# Patient Record
Sex: Female | Born: 1955 | Race: Black or African American | Hispanic: No | Smoking: Never smoker
Health system: Southern US, Community
[De-identification: ages and names within clinical notes are randomized; demographics above are authoritative.]

## PROBLEM LIST (undated history)

## (undated) DIAGNOSIS — I10 Essential (primary) hypertension: Secondary | ICD-10-CM

## (undated) DIAGNOSIS — J45909 Unspecified asthma, uncomplicated: Secondary | ICD-10-CM

## (undated) DIAGNOSIS — M549 Dorsalgia, unspecified: Secondary | ICD-10-CM

## (undated) DIAGNOSIS — G4730 Sleep apnea, unspecified: Secondary | ICD-10-CM

## (undated) DIAGNOSIS — E559 Vitamin D deficiency, unspecified: Secondary | ICD-10-CM

## (undated) DIAGNOSIS — D649 Anemia, unspecified: Secondary | ICD-10-CM

## (undated) DIAGNOSIS — T7840XA Allergy, unspecified, initial encounter: Secondary | ICD-10-CM

## (undated) HISTORY — DX: Anemia, unspecified: D64.9

## (undated) HISTORY — DX: Vitamin D deficiency, unspecified: E55.9

## (undated) HISTORY — DX: Allergy, unspecified, initial encounter: T78.40XA

## (undated) HISTORY — PX: FOOT SURGERY: SHX648

## (undated) HISTORY — DX: Essential (primary) hypertension: I10

## (undated) HISTORY — PX: WISDOM TOOTH EXTRACTION: SHX21

## (undated) HISTORY — DX: Unspecified asthma, uncomplicated: J45.909

## (undated) HISTORY — DX: Dorsalgia, unspecified: M54.9

## (undated) HISTORY — DX: Sleep apnea, unspecified: G47.30

---

## 1995-12-25 HISTORY — PX: PARTIAL HYSTERECTOMY: SHX80

## 2009-12-24 HISTORY — PX: GASTRIC BYPASS: SHX52

## 2012-08-28 ENCOUNTER — Other Ambulatory Visit: Payer: Self-pay | Admitting: Internal Medicine

## 2012-08-28 DIAGNOSIS — Z1231 Encounter for screening mammogram for malignant neoplasm of breast: Secondary | ICD-10-CM

## 2012-09-10 ENCOUNTER — Ambulatory Visit
Admission: RE | Admit: 2012-09-10 | Discharge: 2012-09-10 | Disposition: A | Discharge status: Home / Self Care | Payer: 59 | Source: Ambulatory Visit | Attending: Internal Medicine | Admitting: Internal Medicine

## 2012-09-10 DIAGNOSIS — Z1231 Encounter for screening mammogram for malignant neoplasm of breast: Secondary | ICD-10-CM

## 2013-10-19 ENCOUNTER — Other Ambulatory Visit: Payer: Self-pay

## 2013-10-19 DIAGNOSIS — Z1231 Encounter for screening mammogram for malignant neoplasm of breast: Secondary | ICD-10-CM

## 2013-11-06 ENCOUNTER — Ambulatory Visit
Admission: RE | Admit: 2013-11-06 | Discharge: 2013-11-06 | Disposition: A | Discharge status: Home / Self Care | Payer: 59 | Source: Ambulatory Visit

## 2013-11-06 DIAGNOSIS — Z1231 Encounter for screening mammogram for malignant neoplasm of breast: Secondary | ICD-10-CM

## 2015-07-26 ENCOUNTER — Other Ambulatory Visit: Payer: Self-pay

## 2015-07-26 DIAGNOSIS — Z1231 Encounter for screening mammogram for malignant neoplasm of breast: Secondary | ICD-10-CM

## 2015-08-02 ENCOUNTER — Ambulatory Visit
Admission: RE | Admit: 2015-08-02 | Discharge: 2015-08-02 | Disposition: A | Discharge status: Home / Self Care | Payer: 59 | Source: Ambulatory Visit

## 2015-08-02 DIAGNOSIS — Z1231 Encounter for screening mammogram for malignant neoplasm of breast: Secondary | ICD-10-CM

## 2016-08-20 ENCOUNTER — Other Ambulatory Visit: Payer: Self-pay | Admitting: Internal Medicine

## 2016-08-20 DIAGNOSIS — Z1231 Encounter for screening mammogram for malignant neoplasm of breast: Secondary | ICD-10-CM

## 2016-09-07 ENCOUNTER — Ambulatory Visit
Admission: RE | Admit: 2016-09-07 | Discharge: 2016-09-07 | Disposition: A | Discharge status: Home / Self Care | Payer: 59 | Source: Ambulatory Visit | Attending: Internal Medicine | Admitting: Internal Medicine

## 2016-09-07 DIAGNOSIS — Z1231 Encounter for screening mammogram for malignant neoplasm of breast: Secondary | ICD-10-CM

## 2017-03-13 ENCOUNTER — Ambulatory Visit (INDEPENDENT_AMBULATORY_CARE_PROVIDER_SITE_OTHER): Payer: BLUE CROSS/BLUE SHIELD | Admitting: Physician Assistant

## 2017-03-13 ENCOUNTER — Ambulatory Visit (INDEPENDENT_AMBULATORY_CARE_PROVIDER_SITE_OTHER): Payer: Self-pay

## 2017-03-13 DIAGNOSIS — G8929 Other chronic pain: Secondary | ICD-10-CM | POA: Diagnosis not present

## 2017-03-13 DIAGNOSIS — M7061 Trochanteric bursitis, right hip: Secondary | ICD-10-CM | POA: Diagnosis not present

## 2017-03-13 DIAGNOSIS — M25562 Pain in left knee: Secondary | ICD-10-CM | POA: Diagnosis not present

## 2017-03-13 DIAGNOSIS — M25561 Pain in right knee: Secondary | ICD-10-CM

## 2017-03-13 DIAGNOSIS — M7062 Trochanteric bursitis, left hip: Secondary | ICD-10-CM | POA: Diagnosis not present

## 2017-03-13 NOTE — Progress Notes (Signed)
Office Visit Note   Patient: Nicole Maynard           Date of Birth: 1956/01/05           MRN: [id number] Visit Date: 03/13/2017              Requested by: Alysia PennaScott Holwerda, MD [address] [place], [place] [id number] PCP: Alysia PennaHOLWERDA, SCOTT, MD   Assessment & Plan: Visit Diagnoses:  1. Chronic pain of both knees   2. Trochanteric bursitis of both hips     Plan:Discussed with her cortisone injections so she defers. Therefore will have her DIP band stretching quad strengthening. She'll continue her natural anti-inflammatories. We will see if she can be approved for Synvisc one injections in both knees and have her return once these are available. Questions encouraged and answered at length by Dr. Magnus IvanBlackman and myself.  Follow-Up Instructions: No Follow-up on file.   Orders:  Orders Placed This Encounter  Procedures  . XR Knee 1-2 Views Left  . XR Knee 1-2 Views Right   No orders of the defined types were placed in this encounter.     Procedures: No procedures performed   Clinical Data: No additional findings.   Subjective: Bilateral knee pain  HPI Mrs. Nicole Maynard is a 61 year old female who was last seen by Dr. Magnus IvanBlackman in 2014. At that time was having knee pain is given a cortisone injection both knees did well until recently. Now over the past few months is having pain with prolonged sitting bilateral knees. Also has difficulty going up and downstairs tickly going down stairs and with the right knee. Mechanical symptoms positive on the right only for giving way. No injury to either knee. She does take Aleve occasionally and that this helps. She is on Tumeric. Does have some pain lateral aspect of both hips with a history of trochanteric bursitis which was treated with injections. She states she does not want injections in her hips or knees today. She really does not want to take any prescription medications. She is asking for exercises that she can do with her  trainer.  Review of Systems Denies fevers chills, calf pain, chest pain or shortness of breath. Otherwise please see history of present illness.  Objective: Vital Signs: There were no vitals taken for this visit.  Physical Exam  Constitutional: She is oriented to person, place, and time. She appears well-developed and well-nourished. No distress.  Pulmonary/Chest: Effort normal.  Neurological: She is alert and oriented to person, place, and time.  Skin: She is not diaphoretic.  Psychiatric: She has a normal mood and affect. Her behavior is normal.    Ortho Exam Good range of motion bilateral knees. No significant crepitus with passive range of motion of either knee. Tenderness along medial lateral joint line of both knees. No instability valgus varus stressing of either knee. Anterior drawer is negative bilaterally. Murray's is negative bilaterally. No effusion abnormal warmth erythema of either knee. Tenderness peripatellar right greater than left. Positive Manfred Shirtssmond Clarke on the left. Good range of motion of bilateral hips without pain. Tenderness over the trochanteric region of both hips Specialty Comments:  No specialty comments available.  Imaging: Xr Knee 1-2 Views Left  Result Date: 03/13/2017 Knee 2 views: No acute fracture knees well located. Moderate medial compartmental arthritic changes. Mild patellofemoral. No bony abnormalities otherwise  Xr Knee 1-2 Views Right  Result Date: 03/13/2017 Right knee AP lateral views: No acute fractures. These well located. 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 Mild  patellofemoral arthritic changes.    PMFS History: There are no active problems to display for this patient.  No past medical history on file.  No family history on file.  No past surgical history on file. Social History   Occupational History  . Not on file.   Social History Main Topics  . Smoking status: Not on file  . Smokeless tobacco: Not on file  .  Alcohol use Not on file  . Drug use: Unknown  . Sexual activity: Not on file

## 2017-04-18 ENCOUNTER — Ambulatory Visit (INDEPENDENT_AMBULATORY_CARE_PROVIDER_SITE_OTHER): Payer: BLUE CROSS/BLUE SHIELD | Admitting: Orthopaedic Surgery

## 2017-08-01 ENCOUNTER — Other Ambulatory Visit: Payer: Self-pay | Admitting: Internal Medicine

## 2017-08-01 DIAGNOSIS — Z1231 Encounter for screening mammogram for malignant neoplasm of breast: Secondary | ICD-10-CM

## 2017-09-09 ENCOUNTER — Ambulatory Visit
Admission: RE | Admit: 2017-09-09 | Discharge: 2017-09-09 | Disposition: A | Discharge status: Home / Self Care | Payer: BLUE CROSS/BLUE SHIELD | Source: Ambulatory Visit | Attending: Internal Medicine | Admitting: Internal Medicine

## 2017-09-09 DIAGNOSIS — Z1231 Encounter for screening mammogram for malignant neoplasm of breast: Secondary | ICD-10-CM

## 2017-11-25 ENCOUNTER — Ambulatory Visit: Payer: PRIVATE HEALTH INSURANCE | Admitting: Psychology

## 2018-08-07 ENCOUNTER — Other Ambulatory Visit: Payer: Self-pay | Admitting: Internal Medicine

## 2018-08-07 DIAGNOSIS — Z1231 Encounter for screening mammogram for malignant neoplasm of breast: Secondary | ICD-10-CM

## 2018-08-28 ENCOUNTER — Other Ambulatory Visit: Payer: Self-pay | Admitting: Internal Medicine

## 2018-08-28 DIAGNOSIS — M5412 Radiculopathy, cervical region: Secondary | ICD-10-CM

## 2018-08-29 ENCOUNTER — Other Ambulatory Visit: Payer: Self-pay | Admitting: Internal Medicine

## 2018-08-29 DIAGNOSIS — M5412 Radiculopathy, cervical region: Secondary | ICD-10-CM

## 2018-09-03 ENCOUNTER — Ambulatory Visit
Admission: RE | Admit: 2018-09-03 | Discharge: 2018-09-03 | Disposition: A | Discharge status: Home / Self Care | Payer: BLUE CROSS/BLUE SHIELD | Source: Ambulatory Visit | Attending: Internal Medicine | Admitting: Internal Medicine

## 2018-09-03 DIAGNOSIS — M5412 Radiculopathy, cervical region: Secondary | ICD-10-CM

## 2018-09-10 ENCOUNTER — Ambulatory Visit: Payer: Self-pay

## 2018-09-11 ENCOUNTER — Ambulatory Visit
Admission: RE | Admit: 2018-09-11 | Discharge: 2018-09-11 | Disposition: A | Discharge status: Home / Self Care | Payer: BLUE CROSS/BLUE SHIELD | Source: Ambulatory Visit | Attending: Internal Medicine | Admitting: Internal Medicine

## 2018-09-11 DIAGNOSIS — Z1231 Encounter for screening mammogram for malignant neoplasm of breast: Secondary | ICD-10-CM

## 2019-08-03 ENCOUNTER — Other Ambulatory Visit: Payer: Self-pay | Admitting: Internal Medicine

## 2019-08-03 DIAGNOSIS — Z1231 Encounter for screening mammogram for malignant neoplasm of breast: Secondary | ICD-10-CM

## 2019-09-15 ENCOUNTER — Other Ambulatory Visit: Payer: Self-pay

## 2019-09-15 ENCOUNTER — Ambulatory Visit
Admission: RE | Admit: 2019-09-15 | Discharge: 2019-09-15 | Disposition: A | Discharge status: Home / Self Care | Payer: BC Managed Care – PPO | Source: Ambulatory Visit | Attending: Internal Medicine | Admitting: Internal Medicine

## 2019-09-15 DIAGNOSIS — Z1231 Encounter for screening mammogram for malignant neoplasm of breast: Secondary | ICD-10-CM

## 2020-10-25 ENCOUNTER — Encounter: Payer: Self-pay | Admitting: Internal Medicine

## 2020-12-05 ENCOUNTER — Other Ambulatory Visit: Payer: Self-pay

## 2020-12-05 ENCOUNTER — Ambulatory Visit (AMBULATORY_SURGERY_CENTER): Payer: Self-pay

## 2020-12-05 VITALS — Ht 66.0 in | Wt 189.0 lb

## 2020-12-05 DIAGNOSIS — Z1211 Encounter for screening for malignant neoplasm of colon: Secondary | ICD-10-CM

## 2020-12-05 NOTE — Progress Notes (Signed)

## 2020-12-08 ENCOUNTER — Encounter: Payer: Self-pay | Admitting: Internal Medicine

## 2020-12-19 ENCOUNTER — Encounter: Payer: BC Managed Care – PPO | Admitting: Internal Medicine

## 2021-01-25 ENCOUNTER — Ambulatory Visit (AMBULATORY_SURGERY_CENTER): Payer: BC Managed Care – PPO | Admitting: Internal Medicine

## 2021-01-25 ENCOUNTER — Encounter: Payer: Self-pay | Admitting: Internal Medicine

## 2021-01-25 ENCOUNTER — Other Ambulatory Visit: Payer: Self-pay

## 2021-01-25 VITALS — BP 116/68 | HR 66 | Temp 97.5°F | Resp 18 | Ht 66.0 in | Wt 189.0 lb

## 2021-01-25 DIAGNOSIS — Z1211 Encounter for screening for malignant neoplasm of colon: Secondary | ICD-10-CM

## 2021-01-25 MED ORDER — SODIUM CHLORIDE 0.9 % IV SOLN
500.0000 mL | INTRAVENOUS | Status: AC
Start: 2021-01-25 — End: ?

## 2021-01-25 NOTE — Progress Notes (Signed)
Report given to PACU, vss 

## 2021-01-25 NOTE — Patient Instructions (Addendum)
The colonoscopy exam was normal today.  I did not see any signs of polyps or cancer.  Next routine colonoscopy or other screening test in 10 years - 2032.  I appreciate the opportunity to care for you. Iva Boop, MD, [facility]   Discharge instructions given. Normal exam. Resume previous medications. YOU HAD AN ENDOSCOPIC PROCEDURE TODAY AT THE [facility]:   Refer to the procedure report that was given to you for any specific questions about what was found during the examination.  If the procedure report does not answer your questions, please call your gastroenterologist to clarify.  If you requested that your care partner not be given the details of your procedure findings, then the procedure report has been included in a sealed envelope for you to review at your convenience later.  YOU SHOULD EXPECT: Some feelings of bloating in the abdomen. Passage of more gas than usual.  Walking can help get rid of the air that was put into your GI tract during the procedure and reduce the bloating. If you had a lower endoscopy (such as a colonoscopy or flexible sigmoidoscopy) you may notice spotting of blood in your stool or on the toilet paper. If you underwent a bowel prep for your procedure, you may not have a normal bowel movement for a few days.  Please Note:  You might notice some irritation and congestion in your nose or some drainage.  This is from the oxygen used during your procedure.  There is no need for concern and it should clear up in a day or so.  SYMPTOMS TO REPORT IMMEDIATELY:   Following lower endoscopy (colonoscopy or flexible sigmoidoscopy):  Excessive amounts of blood in the stool  Significant tenderness or worsening of abdominal pains  Swelling of the abdomen that is new, acute  Fever of 100F or higher   For urgent or emergent issues, a gastroenterologist can be reached at any hour by calling [phone]. Do not use MyChart messaging for urgent concerns.     DIET:  We do recommend a small meal at first, but then you may proceed to your regular diet.  Drink plenty of fluids but you should avoid alcoholic beverages for 24 hours.  ACTIVITY:  You should plan to take it easy for the rest of today and you should NOT DRIVE or use heavy machinery until tomorrow (because of the sedation medicines used during the test).    FOLLOW UP: Our staff will call the number listed on your records 48-72 hours following your procedure to check on you and address any questions or concerns that you may have regarding the information given to you following your procedure. If we do not reach you, we will leave a message.  We will attempt to reach you two times.  During this call, we will ask if you have developed any symptoms of COVID 19. If you develop any symptoms (ie: fever, flu-like symptoms, shortness of breath, cough etc.) before then, please call [phone].  If you test positive for Covid 19 in the 2 weeks post procedure, please call and report this information to [place].    If any biopsies were taken you will be contacted by phone or by letter within the next 1-3 weeks.  Please call us at [phone] if you have not heard about the biopsies in 3 weeks.    SIGNATURES/CONFIDENTIALITY: You and/or your care partner have signed paperwork which will be entered into your electronic medical record.  These signatures  attest to the fact that that the information above on your After Visit Summary has been reviewed and is understood.  Full responsibility of the confidentiality of this discharge information lies with you and/or your care-partner.

## 2021-01-25 NOTE — Op Note (Signed)
[facility] Patient Name: Nicole Maynard Procedure Date: 01/25/2021 7:54 AM MRN: [id number] Endoscopist: Iva Boop , MD Age: 65 Referring MD:  Date of Birth: 08-04-1956 Gender: Female Account #: [id number] Procedure:                Colonoscopy Indications:              Screening for colorectal malignant neoplasm Medicines:                Propofol per Anesthesia, Monitored Anesthesia Care Procedure:                Pre-Anesthesia Assessment:                           - Prior to the procedure, a History and Physical                            was performed, and patient medications and                            allergies were reviewed. The patient's tolerance of                            previous anesthesia was also reviewed. The risks                            and benefits of the procedure and the sedation                            options and risks were discussed with the patient.                            All questions were answered, and informed consent                            was obtained. Prior Anticoagulants: The patient has                            taken no previous anticoagulant or antiplatelet                            agents. ASA Grade Assessment: II - A patient with                            mild systemic disease. After reviewing the risks                            and benefits, the patient was deemed in                            satisfactory condition to undergo the procedure.                           After obtaining informed consent, the colonoscope  was passed under direct vision. Throughout the                            procedure, the patient's blood pressure, pulse, and                            oxygen saturations were monitored continuously. The                            Olympus PFC-[id number] (#[id number]) Colonoscope was                            introduced through the anus and advanced to the the                             cecum, identified by appendiceal orifice and                            ileocecal valve. The quality of the bowel                            preparation was excellent. The colonoscopy was                            performed without difficulty. The patient tolerated                            the procedure well. The bowel preparation used was                            Miralax via split dose instruction. Scope In: 8:04:25 AM Scope Out: 8:19:41 AM Scope Withdrawal Time: 0 hours 10 minutes 57 seconds  Total Procedure Duration: 0 hours 15 minutes 16 seconds  Findings:                 The perianal and digital rectal examinations were                            normal.                           The colon (entire examined portion) appeared normal.                           No additional abnormalities were found on                            retroflexion. Complications:            No immediate complications. Estimated blood loss:                            None. Estimated Blood Loss:     Estimated blood loss: none. Recommendation:           - Repeat colonoscopy in 10 years for screening  purposes.                           - Patient has a contact number available for                            emergencies. The signs and symptoms of potential                            delayed complications were discussed with the                            patient. Return to normal activities tomorrow.                            Written discharge instructions were provided to the                            patient.                           - Resume previous diet.                           - Continue present medications. Iva Boop, MD 01/25/2021 8:25:54 AM This report has been signed electronically.

## 2021-01-25 NOTE — Progress Notes (Signed)
Vs CW  Pt's states no medical or surgical changes since previsit or office visit.  

## 2021-01-27 ENCOUNTER — Telehealth: Payer: Self-pay

## 2021-01-27 NOTE — Telephone Encounter (Signed)
Second follow up call attempt, no answer LM 

## 2021-01-27 NOTE — Telephone Encounter (Signed)
First post procedure follow up call, no answer 

## 2021-03-03 IMAGING — MG MM DIGITAL SCREENING BILAT W/ TOMO W/ CAD
8 series · 8 of 24 positions shown · non-contrast
Comparison: Previous exam(s).

CLINICAL DATA: Screening.

EXAM:
DIGITAL SCREENING BILATERAL MAMMOGRAM WITH TOMO AND CAD

[L CC synth-2D]
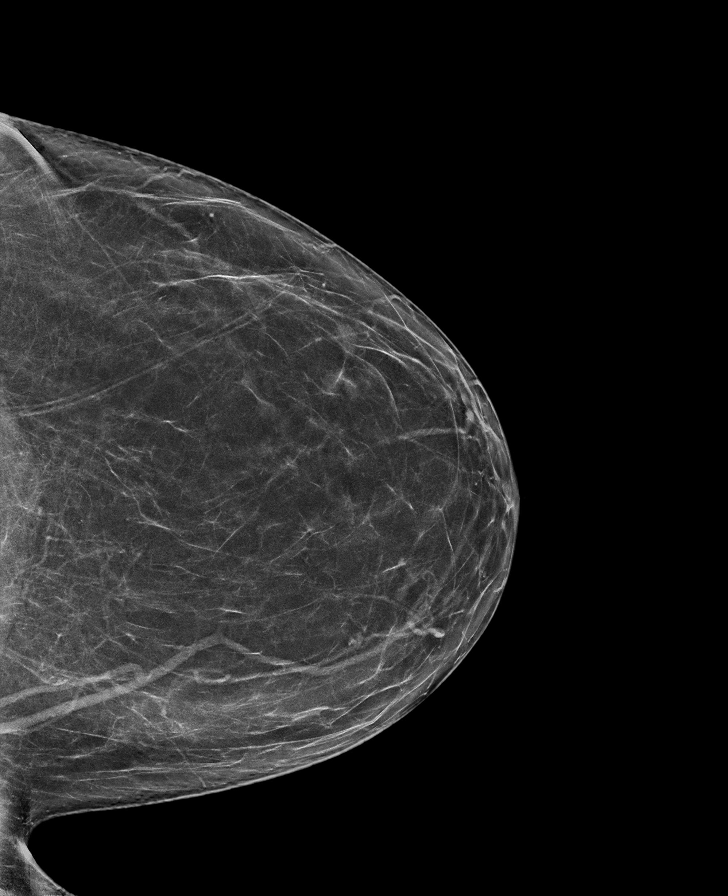

[L MLO synth-2D]
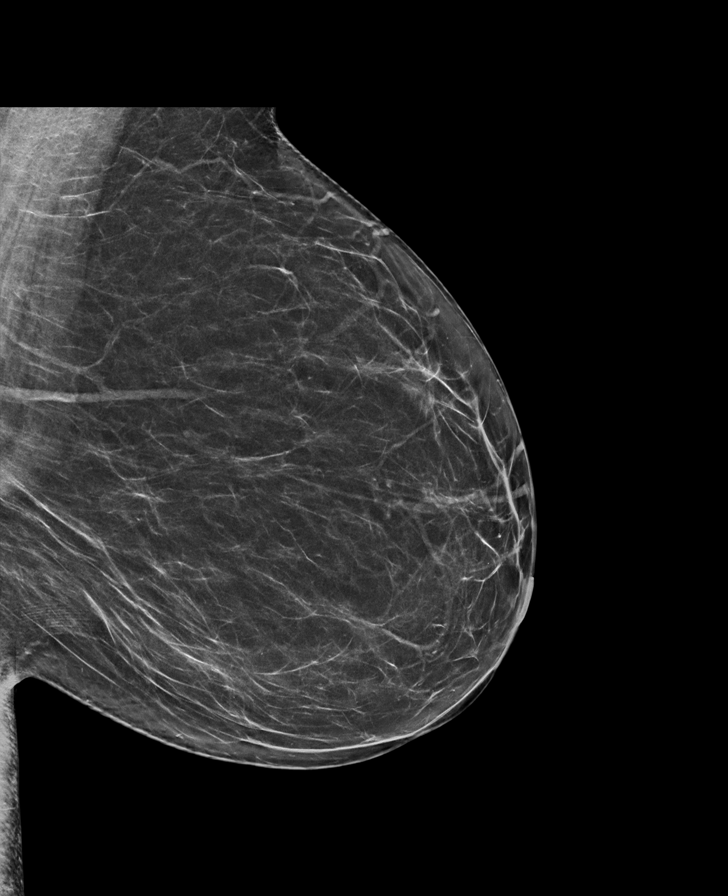

[R CC synth-2D]
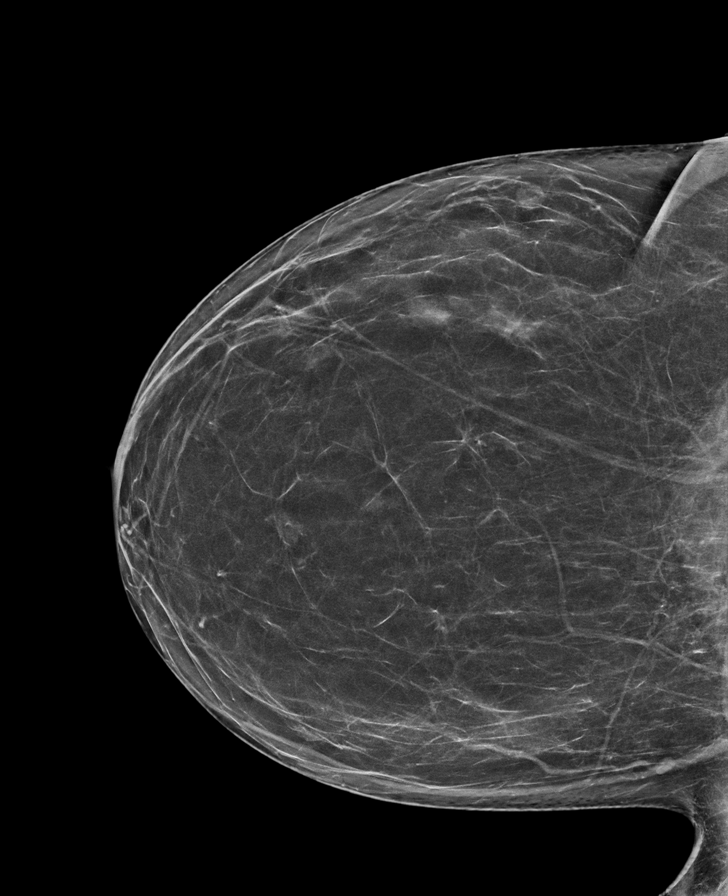

[R MLO synth-2D]
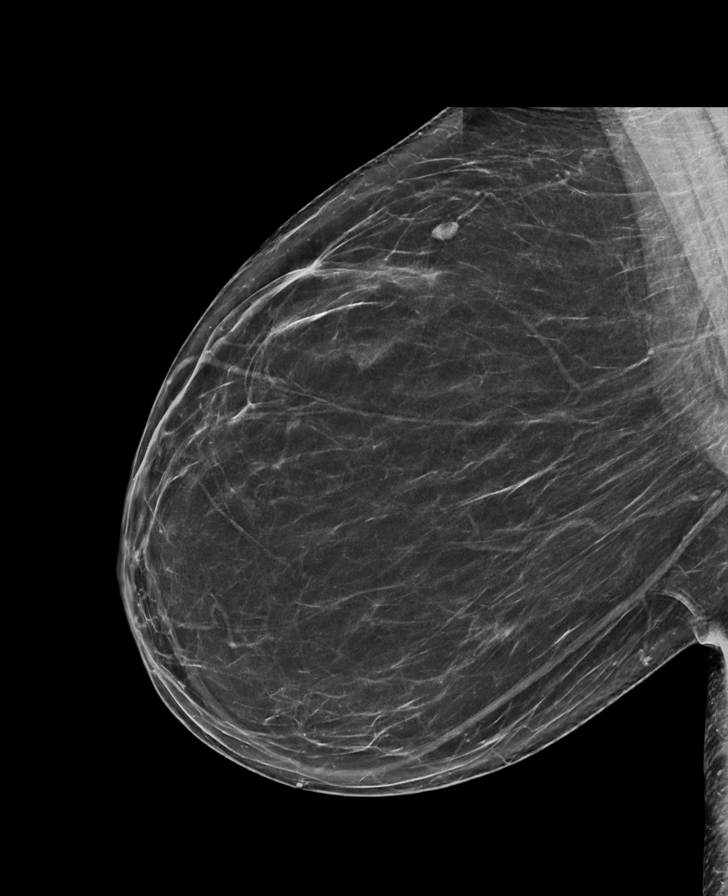

[R MLO tomo · tomo slice 39/78.0]
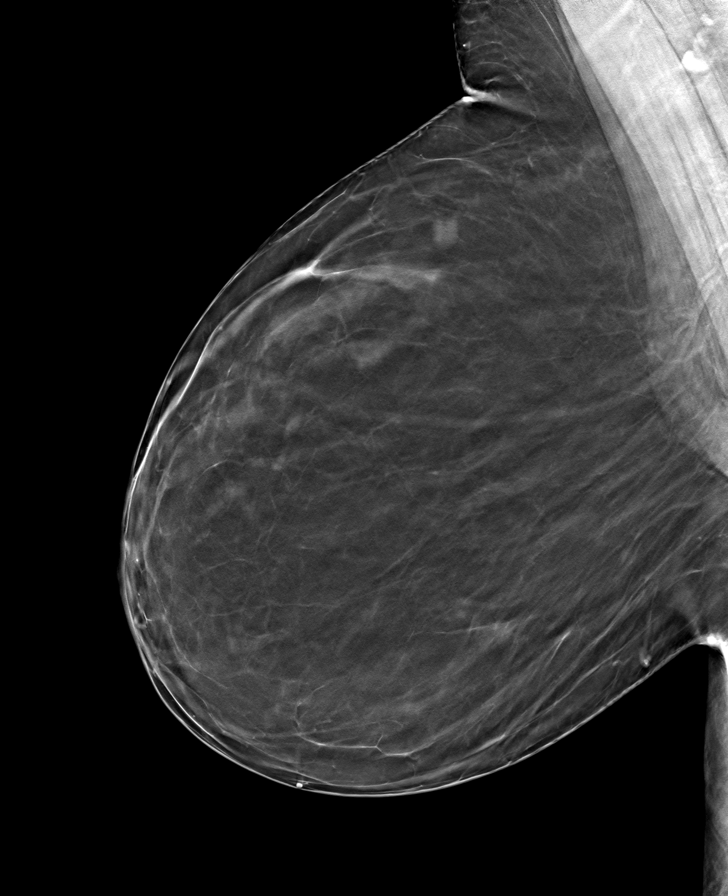

[L CC tomo · tomo slice 37/73.0]
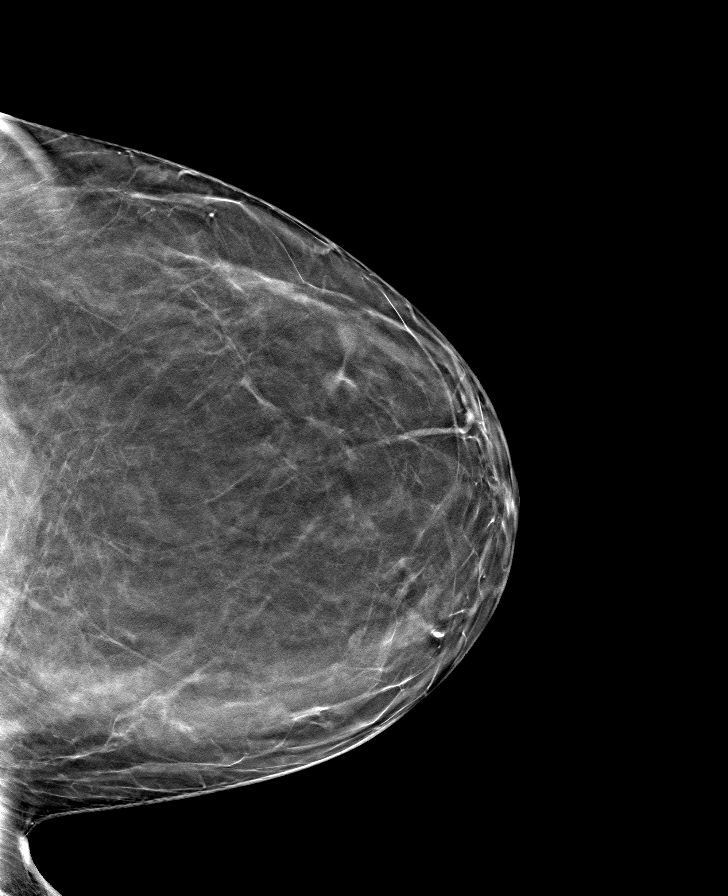

[L MLO tomo · tomo slice 37/74.0]
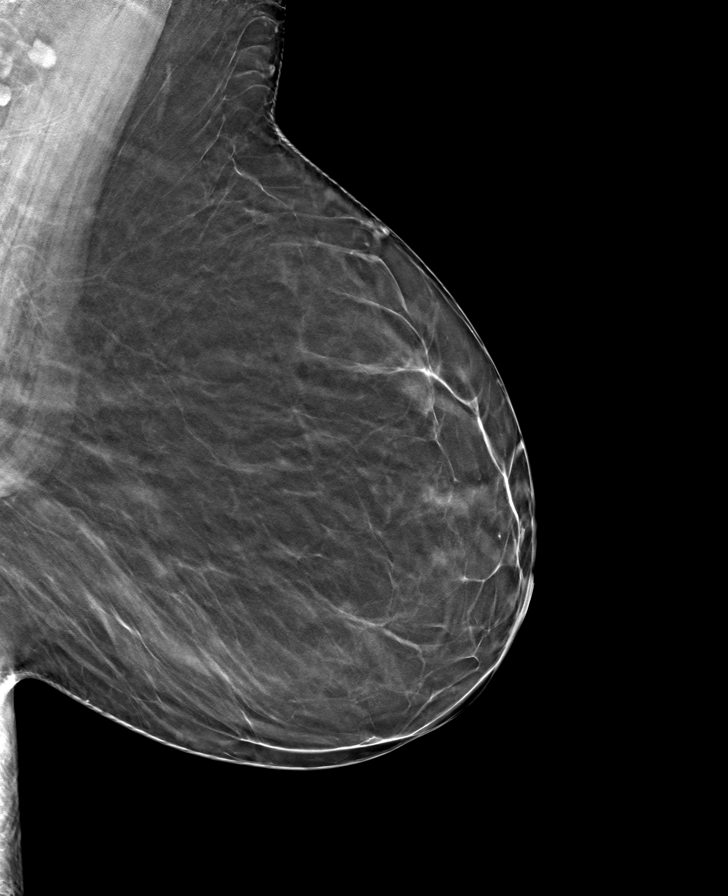

[R CC tomo · tomo slice 36/71.0]
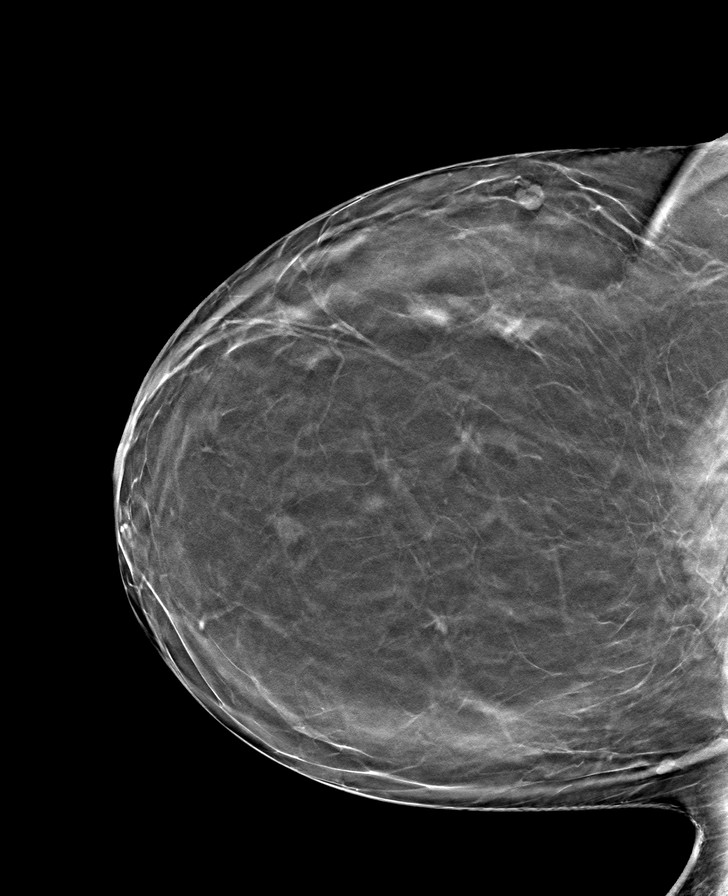

[8 of 24 positions shown; findings below may reference images not displayed]

ACR Breast Density Category b: There are scattered areas of
fibroglandular density.
FINDINGS: There are no findings suspicious for malignancy. Images were
processed with CAD.
IMPRESSION: No mammographic evidence of malignancy. A result letter of this
screening mammogram will be mailed directly to the patient.

RECOMMENDATION:
Screening mammogram in one year. (Code:CN-U-775)

BI-RADS CATEGORY  1: Negative.

## 2021-08-17 ENCOUNTER — Other Ambulatory Visit: Payer: Self-pay | Admitting: Internal Medicine

## 2021-08-17 DIAGNOSIS — I10 Essential (primary) hypertension: Secondary | ICD-10-CM

## 2021-08-23 ENCOUNTER — Other Ambulatory Visit: Payer: BC Managed Care – PPO

## 2021-09-06 ENCOUNTER — Ambulatory Visit
Admission: RE | Admit: 2021-09-06 | Discharge: 2021-09-06 | Disposition: A | Discharge status: Home / Self Care | Payer: BC Managed Care – PPO | Source: Ambulatory Visit | Attending: Internal Medicine | Admitting: Internal Medicine

## 2021-09-06 DIAGNOSIS — I10 Essential (primary) hypertension: Secondary | ICD-10-CM

## 2022-10-30 ENCOUNTER — Ambulatory Visit (INDEPENDENT_AMBULATORY_CARE_PROVIDER_SITE_OTHER): Payer: Self-pay | Admitting: Family Medicine

## 2022-10-30 ENCOUNTER — Encounter (INDEPENDENT_AMBULATORY_CARE_PROVIDER_SITE_OTHER): Payer: Self-pay | Admitting: Family Medicine

## 2022-10-30 VITALS — BP 116/75 | HR 69 | Temp 98.3°F | Ht 66.0 in | Wt 189.0 lb

## 2022-10-30 DIAGNOSIS — I10 Essential (primary) hypertension: Secondary | ICD-10-CM

## 2022-10-30 DIAGNOSIS — Z0289 Encounter for other administrative examinations: Secondary | ICD-10-CM

## 2022-10-30 DIAGNOSIS — R635 Abnormal weight gain: Secondary | ICD-10-CM

## 2022-10-30 DIAGNOSIS — Z6830 Body mass index (BMI) 30.0-30.9, adult: Secondary | ICD-10-CM

## 2022-10-30 DIAGNOSIS — Z9884 Bariatric surgery status: Secondary | ICD-10-CM

## 2022-10-30 DIAGNOSIS — E669 Obesity, unspecified: Secondary | ICD-10-CM

## 2022-11-08 NOTE — Progress Notes (Signed)
Office: [phone]  /  Fax: [phone]   Initial Visit  Nicole Maynard was seen in clinic today to evaluate for obesity. She is interested in losing weight to improve overall health and reduce the risk of weight related complications. She presents today to review program treatment options, initial physical assessment, and evaluation.     She was referred by: PCP  When asked what else they would like to accomplish? She states: Improve existing medical conditions  When asked how has your weight affected you? 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   Nicole Maynard promoting medications identified: Other: N/A  Current nutrition plan: Other: Plant based diet.   Current level of physical activity: Walking  Current or previous pharmacotherapy: None  Response to medication: Never tried medications  Past medical history includes:   Past Medical History:  Diagnosis Date   Allergy    hx of food allergies- changed diet to plant based - no issues since 2014   Asthma    hx of- no issues since 2014   Hypertension    on meds   Objective:   BP 116/75   Pulse 69   Temp 98.3 F (36.8 C)   Ht 5\' 6"  (1.676 m)   Wt 189 lb (85.7 kg)   SpO2 99%   BMI 30.51 kg/m  She was weighed on the bioimpedance scale: Body mass index is 30.51 kg/m. Visceral Fat Rating:11, Body Fat%:41.1  General:  Alert, oriented and cooperative. Patient is in no acute distress.  Respiratory: Normal respiratory effort, no problems with respiration noted  Extremities: Normal range of motion.    Mental Status: Normal mood and affect. Normal behavior. Normal judgment and thought content.   Assessment and Plan:  1. Weight gain following gastric bypass surgery Pre-op:  274 lbs to 178 lbs Nadir .  Surgery done in 2011, [place], [place].  She has adequate restriction.  Takes an iron and  Vitamin D supplement.   Plan to increase dietary protein intake.  Currently lacking. Obtain labs next visit for Vitamin levels.   2. Essential hypertension Blood pressure controlled on Benicar 20 mg daily.  She denies chest pain.   Continue current blood pressure medications per PCP.   3. Obesity, current BMI 30.5 1) Reviewed obesity as a disease. 2) Reviewed information about our program. 3) Reviewed today's Bioimpedance results.  4) Goal:  to get under previous post op Nadir weight of 178 lbs.   We reviewed weight, biometrics, associated medical conditions and contributing factors with patient. She would benefit from weight loss therapy via a modified calorie, low-carb, high-protein nutritional plan tailored to their REE (resting energy expenditure) which will be determined by indirect calorimetry.  We will also assess for cardiometabolic risk and nutritional derangements via fasting serologies at her next appointment.     Obesity Treatment / Action Plan:  Will be scheduled for indirect calorimetry to determine resting energy expenditure in a fasting state.  This will allow Nicole Maynard to create a reduced calorie, high-protein meal plan to promote loss of fat mass while preserving muscle mass. Will think about ideas on how to incorporate physical activity into their daily routine. Was counseled on nutritional approaches to weight loss and benefits of complex carbs and high quality protein as part of nutritional weight management.  Obesity Education Performed Today:  She was weighed on the bioimpedance scale and results were  discussed and documented in the synopsis.  We discussed obesity as a disease and the importance of a more detailed evaluation of all the factors contributing to the disease.  We discussed the importance of long term lifestyle changes which include nutrition, exercise and behavioral modifications as well as the importance of customizing this to her specific health and social  needs.  We discussed the benefits of reaching a healthier weight to alleviate the symptoms of existing conditions and reduce the risks of the biomechanical, metabolic and psychological effects of obesity.  Nicole Maynard appears to be in the action stage of change and states they are ready to start intensive lifestyle modifications and behavioral modifications.  30 minutes was spent today on this visit including the above counseling, pre-visit chart review, and post-visit documentation.  Reviewed by clinician on day of visit: allergies, medications, problem list, medical history, surgical history, family history, social history, and previous encounter notes.  I, Malcolm Metro, am acting as Energy manager for Seymour Bars, DO.   I have reviewed the above documentation for accuracy and completeness, and I agree with the above. Glennis Brink, DO

## 2022-11-28 ENCOUNTER — Encounter (INDEPENDENT_AMBULATORY_CARE_PROVIDER_SITE_OTHER): Payer: Self-pay | Admitting: Family Medicine

## 2022-11-28 ENCOUNTER — Ambulatory Visit (INDEPENDENT_AMBULATORY_CARE_PROVIDER_SITE_OTHER): Payer: BC Managed Care – PPO | Admitting: Family Medicine

## 2022-11-28 VITALS — BP 130/72 | HR 79 | Temp 98.0°F | Ht 66.0 in | Wt 191.0 lb

## 2022-11-28 DIAGNOSIS — E669 Obesity, unspecified: Secondary | ICD-10-CM

## 2022-11-28 DIAGNOSIS — E559 Vitamin D deficiency, unspecified: Secondary | ICD-10-CM | POA: Diagnosis not present

## 2022-11-28 DIAGNOSIS — R0602 Shortness of breath: Secondary | ICD-10-CM | POA: Diagnosis not present

## 2022-11-28 DIAGNOSIS — I10 Essential (primary) hypertension: Secondary | ICD-10-CM | POA: Diagnosis not present

## 2022-11-28 DIAGNOSIS — R5383 Other fatigue: Secondary | ICD-10-CM

## 2022-11-28 DIAGNOSIS — D508 Other iron deficiency anemias: Secondary | ICD-10-CM | POA: Diagnosis not present

## 2022-11-28 DIAGNOSIS — Z1331 Encounter for screening for depression: Secondary | ICD-10-CM

## 2022-11-28 DIAGNOSIS — Z9884 Bariatric surgery status: Secondary | ICD-10-CM

## 2022-11-28 DIAGNOSIS — Z6831 Body mass index (BMI) 31.0-31.9, adult: Secondary | ICD-10-CM

## 2022-11-29 DIAGNOSIS — Z1331 Encounter for screening for depression: Secondary | ICD-10-CM | POA: Insufficient documentation

## 2022-11-29 DIAGNOSIS — E559 Vitamin D deficiency, unspecified: Secondary | ICD-10-CM | POA: Insufficient documentation

## 2022-11-29 DIAGNOSIS — Z9884 Bariatric surgery status: Secondary | ICD-10-CM | POA: Insufficient documentation

## 2022-11-29 DIAGNOSIS — R0602 Shortness of breath: Secondary | ICD-10-CM | POA: Insufficient documentation

## 2022-11-29 DIAGNOSIS — R5383 Other fatigue: Secondary | ICD-10-CM | POA: Insufficient documentation

## 2022-11-29 DIAGNOSIS — D649 Anemia, unspecified: Secondary | ICD-10-CM | POA: Insufficient documentation

## 2022-12-03 LAB — COMPREHENSIVE METABOLIC PANEL
ALT: 14 IU/L (ref 0–32)
AST: 21 IU/L (ref 0–40)
Albumin/Globulin Ratio: 2.3 — ABNORMAL HIGH (ref 1.2–2.2)
Albumin: 4.8 g/dL (ref 3.9–4.9)
Alkaline Phosphatase: 105 IU/L (ref 44–121)
BUN/Creatinine Ratio: 16 (ref 12–28)
BUN: 12 mg/dL (ref 8–27)
Bilirubin Total: 0.4 mg/dL (ref 0.0–1.2)
CO2: 24 mmol/L (ref 20–29)
Calcium: 9.4 mg/dL (ref 8.7–10.3)
Chloride: 103 mmol/L (ref 96–106)
Creatinine, Ser: 0.75 mg/dL (ref 0.57–1.00)
Globulin, Total: 2.1 g/dL (ref 1.5–4.5)
Glucose: 81 mg/dL (ref 70–99)
Potassium: 4.4 mmol/L (ref 3.5–5.2)
Sodium: 140 mmol/L (ref 134–144)
Total Protein: 6.9 g/dL (ref 6.0–8.5)
eGFR: 89 mL/min/{1.73_m2} (ref >59)

## 2022-12-03 LAB — IRON AND TIBC
Iron Saturation: 20 % (ref 15–55)
Iron: 80 ug/dL (ref 27–139)
Total Iron Binding Capacity: 401 ug/dL (ref 250–450)
UIBC: 321 ug/dL (ref 118–369)

## 2022-12-03 LAB — CBC
Hematocrit: 39.5 % (ref 34.0–46.6)
Hemoglobin: 12.7 g/dL (ref 11.1–15.9)
MCH: 30.1 pg (ref 26.6–33.0)
MCHC: 32.2 g/dL (ref 31.5–35.7)
MCV: 94 fL (ref 79–97)
Platelets: 306 10*3/uL (ref 150–450)
RBC: 4.22 x10E6/uL (ref 3.77–5.28)
RDW: 15 % (ref 11.7–15.4)
WBC: 3.3 10*3/uL — ABNORMAL LOW (ref 3.4–10.8)

## 2022-12-03 LAB — FERRITIN: Ferritin: 26 ng/mL (ref 15–150)

## 2022-12-03 LAB — LIPID PANEL
Chol/HDL Ratio: 2.5 ratio (ref 0.0–4.4)
Cholesterol, Total: 195 mg/dL (ref 100–199)
HDL: 78 mg/dL (ref >39)
LDL Chol Calc (NIH): 107 mg/dL — ABNORMAL HIGH (ref 0–99)
Triglycerides: 55 mg/dL (ref 0–149)
VLDL Cholesterol Cal: 10 mg/dL (ref 5–40)

## 2022-12-03 LAB — VITAMIN B12: Vitamin B-12: 615 pg/mL (ref 232–1245)

## 2022-12-03 LAB — HEMOGLOBIN A1C
Est. average glucose Bld gHb Est-mCnc: 111 mg/dL
Hgb A1c MFr Bld: 5.5 % (ref 4.8–5.6)

## 2022-12-03 LAB — INSULIN, RANDOM: INSULIN: 4.3 u[IU]/mL (ref 2.6–24.9)

## 2022-12-03 LAB — FOLATE: Folate: 17.5 ng/mL (ref >3.0)

## 2022-12-03 LAB — PREALBUMIN: PREALBUMIN: 25 mg/dL (ref 10–36)

## 2022-12-03 LAB — VITAMIN D, 25-HYDROXY, TOTAL: Vitamin D, 25-Hydroxy, Serum: 52 ng/mL

## 2022-12-03 LAB — TSH RFX ON ABNORMAL TO FREE T4: TSH: 1.33 u[IU]/mL (ref 0.450–4.500)

## 2022-12-06 NOTE — Progress Notes (Signed)
Chief Complaint:   OBESITY Nicole Maynard (MR# [id number]) is a 66 y.o. female who presents for evaluation and treatment of obesity and related comorbidities. Current BMI is Body mass index is 30.83 kg/m. Nicole Maynard has been struggling with her weight for many years and has been unsuccessful in either losing weight, maintaining weight loss, or reaching her healthy weight goal.  Nicole Maynard is currently in the action stage of change and ready to dedicate time achieving and maintaining a healthier weight. Nicole Maynard is interested in becoming our patient and working on intensive lifestyle modifications including (but not limited to) diet and exercise for weight loss.  Nicole Maynard habits were reviewed today and are as follows: her desired weight loss is 26 lbs, she has been heavy most of her life, she started gaining weight in her 40's, her heaviest weight ever was 274 pounds, she is a picky eater and doesn't like to eat healthier foods, and she is frequently drinking liquids with calories.  Depression Screen Nicole Maynard's Food and Mood (modified PHQ-9) score was 4.  Subjective:   1. Other fatigue Nicole Maynard admits to daytime somnolence and admits to waking up still tired. Patient has a history of symptoms of daytime fatigue, morning fatigue, and morning headache. Nicole Maynard generally gets 6 hours of sleep per night, and states that she has nightime awakenings. Snoring is present. Apneic episodes are present. Epworth Sleepiness Score is 5.  EKG and IC reviewed today.  2. SOBOE (shortness of breath on exertion) Nicole Maynard notes increasing shortness of breath with exercising and seems to be worsening over time with weight gain. She notes getting out of breath sooner with activity than she used to. This has not gotten worse recently. Nicole Maynard denies shortness of breath at rest or orthopnea.  3. Essential hypertension Blood pressure goal.  She is on olmesartan 40 mg daily.  She denies adverse  side effects.  She eats a low-sodium diet.  4. Other iron deficiency anemia Patient is on ferrous sulfate 325 mg daily.  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  CRC screening done 01/2021, by Dr. Leone Payor.  5. Vitamin D deficiency Patient is on prescription vitamin D 50,000 IU weekly.  6. H/O gastric bypass Surgery done in [place], [place] in 2011.  Preop weight 274 LBS, postop nadir weight 178 LBS.  She has some restriction, she denies nausea or vomiting.  Assessment/Plan:   1. Other fatigue Nicole Maynard does feel that her weight is causing her energy to be lower than it should be. Fatigue may be related to obesity, depression or many other causes. Labs will be ordered, and in the meanwhile, Nicole Maynard will focus on self care including making healthy food choices, increasing physical activity and focusing on stress reduction.  Update fasting labs today.  - EKG 12-Lead - Comprehensive metabolic panel - Lipid panel - Vitamin B12 - Vitamin D, 25-Hydroxy, Total - CBC - TSH Rfx on Abnormal to Free T4 - Hemoglobin A1c - Insulin, random - Folate - Ferritin - Iron and TIBC  2. SOBOE (shortness of breath on exertion) Nicole Maynard does feel that she gets out of breath more easily that she used to when she exercises. Nicole Maynard's shortness of breath appears to be obesity related and exercise induced. She has agreed to work on weight loss and gradually increase exercise to treat her exercise induced shortness of breath. Will continue to monitor closely.  - EKG 12-Lead  3. Essential hypertension Continue olmesartan 40 mg daily.  4. Other iron deficiency anemia  Check iron level today consider change to ferrous gluconate which is easier on GI tract following gastric bypass.  5. Vitamin D deficiency Check vitamin D level today goal of greater than 50  6. H/O gastric bypass Check labs to look for postop malabsorption, adequate protein intake.  7. Depression  screening Nicole Maynard had a positive depression screening. Depression is commonly associated with obesity and often results in emotional eating behaviors. We will monitor this closely and work on CBT to help improve the non-hunger eating patterns. Referral to Psychology may be required if no improvement is seen as she continues in our clinic.  - Prealbumin  8. Obesity,current BMI 31.0 1.  Discussed vegan protein options. 2.  Discussed raw sugar and honey. 3.  Limit fruit quantity in smoothies.  Nicole Maynard is currently in the action stage of change and her goal is to continue with weight loss efforts. I recommend Nicole Maynard begin the structured treatment plan as follows:  She has agreed to the [employer].  Exercise goals: Try yoga at home.   Behavioral modification strategies: increasing lean protein intake, increasing vegetables, increasing water intake, decreasing liquid calories, decreasing eating out, no skipping meals, meal planning and cooking strategies, better snacking choices, and planning for success.  She was informed of the importance of frequent follow-up visits to maximize her success with intensive lifestyle modifications for her multiple health conditions. She was informed we would discuss her lab results at her next visit unless there is a critical issue that needs to be addressed sooner. Nicole Maynard agreed to keep her next visit at the agreed upon time to discuss these results.  Objective:   Blood pressure 130/72, pulse 79, temperature 98 F (36.7 C), height 5\' 6"  (1.676 m), weight 191 lb (86.6 kg), SpO2 100 %. Body mass index is 30.83 kg/m.  EKG: Normal sinus rhythm, rate 84 BPM.  Indirect Calorimeter completed today shows a VO2 of 220 and a REE of 1512.  Her calculated basal metabolic rate is thus her basal metabolic rate is worse than expected.  General: Cooperative, alert, well developed, in no acute distress. HEENT: Conjunctivae and lids  unremarkable. Cardiovascular: Regular rhythm.  Lungs: Normal work of breathing. Neurologic: No focal deficits.   Lab Results  Component Value Date   CREATININE 0.75 11/28/2022   BUN 12 11/28/2022   NA 140 11/28/2022   K 4.4 11/28/2022   CL 103 11/28/2022   CO2 24 11/28/2022   Lab Results  Component Value Date   ALT 14 11/28/2022   AST 21 11/28/2022   ALKPHOS 105 11/28/2022   BILITOT 0.4 11/28/2022   Lab Results  Component Value Date   HGBA1C 5.5 11/28/2022   Lab Results  Component Value Date   INSULIN 4.3 11/28/2022   Lab Results  Component Value Date   TSH 1.330 11/28/2022   Lab Results  Component Value Date   CHOL 195 11/28/2022   HDL 78 11/28/2022   LDLCALC 107 (H) 11/28/2022   TRIG 55 11/28/2022   CHOLHDL 2.5 11/28/2022   Lab Results  Component Value Date   WBC 3.3 (L) 11/28/2022   HGB 12.7 11/28/2022   HCT 39.5 11/28/2022   MCV 94 11/28/2022   PLT 306 11/28/2022   Lab Results  Component Value Date   IRON 80 11/28/2022   TIBC 401 11/28/2022   FERRITIN 26 11/28/2022   Attestation Statements:   Reviewed by clinician on day of visit: allergies, medications, problem list, medical history, surgical history, family history, social  history, and previous encounter notes.  I, Malcolm Metro, am acting as Energy manager for Seymour Bars, DO.  I have personally spent 45 minutes total time today in preparation, patient care, and documentation for this visit, including the following: review of clinical lab tests; review of medical tests/procedures/services.    I have reviewed the above documentation for accuracy and completeness, and I agree with the above. Glennis Brink, DO

## 2022-12-12 ENCOUNTER — Ambulatory Visit (INDEPENDENT_AMBULATORY_CARE_PROVIDER_SITE_OTHER): Payer: BC Managed Care – PPO | Admitting: Family Medicine

## 2022-12-12 ENCOUNTER — Encounter (INDEPENDENT_AMBULATORY_CARE_PROVIDER_SITE_OTHER): Payer: Self-pay | Admitting: Family Medicine

## 2022-12-12 VITALS — BP 108/69 | HR 96 | Temp 98.6°F | Ht 66.0 in | Wt 186.0 lb

## 2022-12-12 DIAGNOSIS — K909 Intestinal malabsorption, unspecified: Secondary | ICD-10-CM | POA: Insufficient documentation

## 2022-12-12 DIAGNOSIS — D72819 Decreased white blood cell count, unspecified: Secondary | ICD-10-CM | POA: Insufficient documentation

## 2022-12-12 DIAGNOSIS — R635 Abnormal weight gain: Secondary | ICD-10-CM

## 2022-12-12 DIAGNOSIS — E559 Vitamin D deficiency, unspecified: Secondary | ICD-10-CM | POA: Diagnosis not present

## 2022-12-12 DIAGNOSIS — Z6830 Body mass index (BMI) 30.0-30.9, adult: Secondary | ICD-10-CM

## 2022-12-12 DIAGNOSIS — K9089 Other intestinal malabsorption: Secondary | ICD-10-CM

## 2022-12-12 DIAGNOSIS — D72818 Other decreased white blood cell count: Secondary | ICD-10-CM

## 2022-12-12 DIAGNOSIS — E669 Obesity, unspecified: Secondary | ICD-10-CM

## 2022-12-12 DIAGNOSIS — Z9884 Bariatric surgery status: Secondary | ICD-10-CM | POA: Insufficient documentation

## 2022-12-19 NOTE — Progress Notes (Signed)
Chief Complaint:   OBESITY Nicole Maynard is here to discuss her progress with her obesity treatment plan along with follow-up of her obesity related diagnoses. [name] is on the [employer] and states she is following her eating plan approximately 98% of the time. [name] states she is not exercising.  Today's visit was #: 2 Starting weight: 191 lbs Starting date: 11/28/2022 Today's weight: 186 lbs Today's date: 12/12/2022 Total lbs lost to date: 5 LBS Total lbs lost since last in-office visit: 5 LBS  Interim History: has improved compliance on meal plan and  reduced snacking.  She previously was eating too many grapes.  Not skipping meals.  She denies hunger or cravings.  She plans to go into gym or use app and gym.  Gets free gym membership through work.  Subjective:   1. Weight gain status post gastric bypass Discussed labs with patient today. Improving.  Surgery done in [place], [place] in 2011.  Preop at 274 LBS now at 178 LBS Nadir.  Has some restriction.  Not taking a multivitamin daily.  2. Other specified intestinal malabsorption Discussed labs with patient today. Due to gastric bypass.  Reviewed labs, B12 low/normal 615 on 11/28/2022.  Ferritin low/normal at 26.  3. Other decreased white blood cell (WBC) count Discussed labs with patient today. Mild, WBC at 3.3.  Denies recent viral infection but does donate blood.  Denies fever, or chills.    4. Vitamin D deficiency Discussed labs with patient today. Vitamin D level of 52 on 11/28/2022.  Patient is on prescription vitamin D 50,000 IU weekly through PCP.  Assessment/Plan:   1. Weight gain status post gastric bypass Reviewed labs, prealbumin adequate, getting enough protein post gastric bypass.  2. Other specified intestinal malabsorption Began OTC vitamin B12 500 mcg 3 days/week.  Continue prescription ferrous sulfate 325 mg daily.  3. Other decreased white blood cell (WBC) count Recheck CBC  next visit.  4. Vitamin D deficiency Continue prescription vitamin D 50,000 IU weekly through PCP.  Goal is greater than 50.  5. Obesity,current BMI 30.1 1.  Increase water intake to 90 ounces per day. 2.  Firm plans to start workouts in January.  Nicole Maynard is currently in the action stage of change. As such, her goal is to continue with weight loss efforts. She has agreed to the [employer].   Exercise goals:  Tracking steps with Apple watch.  Behavioral modification strategies: increasing lean protein intake, increasing vegetables, increasing water intake, increasing high fiber foods, decreasing eating out, no skipping meals, meal planning and cooking strategies, holiday eating strategies , and planning for success.  Nicole Maynard has agreed to follow-up with our clinic in 3 weeks. She was informed of the importance of frequent follow-up visits to maximize her success with intensive lifestyle modifications for her multiple health conditions.   Objective:   Blood pressure 108/69, pulse 96, temperature 98.6 F (37 C), height 5\' 6"  (1.676 m), weight 186 lb (84.4 kg), SpO2 100 %. Body mass index is 30.02 kg/m.  General: Cooperative, alert, well developed, in no acute distress. HEENT: Conjunctivae and lids unremarkable. Cardiovascular: Regular rhythm.  Lungs: Normal work of breathing. Neurologic: No focal deficits.   Lab Results  Component Value Date   CREATININE 0.75 11/28/2022   BUN 12 11/28/2022   NA 140 11/28/2022   K 4.4 11/28/2022   CL 103 11/28/2022   CO2 24 11/28/2022   Lab Results  Component Value Date   ALT 14 11/28/2022  AST 21 11/28/2022   ALKPHOS 105 11/28/2022   BILITOT 0.4 11/28/2022   Lab Results  Component Value Date   HGBA1C 5.5 11/28/2022   Lab Results  Component Value Date   INSULIN 4.3 11/28/2022   Lab Results  Component Value Date   TSH 1.330 11/28/2022   Lab Results  Component Value Date   CHOL 195 11/28/2022   HDL 78 11/28/2022    LDLCALC 107 (H) 11/28/2022   TRIG 55 11/28/2022   CHOLHDL 2.5 11/28/2022   No results found for: "VD25OH" Lab Results  Component Value Date   WBC 3.3 (L) 11/28/2022   HGB 12.7 11/28/2022   HCT 39.5 11/28/2022   MCV 94 11/28/2022   PLT 306 11/28/2022   Lab Results  Component Value Date   IRON 80 11/28/2022   TIBC 401 11/28/2022   FERRITIN 26 11/28/2022   Attestation Statements:   Reviewed by clinician on day of visit: allergies, medications, problem list, medical history, surgical history, family history, social history, and previous encounter notes.  I, Davy Pique, am acting as Location manager for Loyal Gambler, DO.  I have reviewed the above documentation for accuracy and completeness, and I agree with the above. Dell Ponto, DO

## 2023-01-02 ENCOUNTER — Ambulatory Visit (INDEPENDENT_AMBULATORY_CARE_PROVIDER_SITE_OTHER): Payer: BC Managed Care – PPO | Admitting: Family Medicine

## 2023-01-02 ENCOUNTER — Encounter (INDEPENDENT_AMBULATORY_CARE_PROVIDER_SITE_OTHER): Payer: Self-pay | Admitting: Family Medicine

## 2023-01-02 VITALS — BP 134/77 | HR 72 | Temp 98.0°F | Ht 66.0 in | Wt 189.0 lb

## 2023-01-02 DIAGNOSIS — E559 Vitamin D deficiency, unspecified: Secondary | ICD-10-CM

## 2023-01-02 DIAGNOSIS — K9089 Other intestinal malabsorption: Secondary | ICD-10-CM | POA: Diagnosis not present

## 2023-01-02 DIAGNOSIS — Z6830 Body mass index (BMI) 30.0-30.9, adult: Secondary | ICD-10-CM

## 2023-01-02 DIAGNOSIS — E669 Obesity, unspecified: Secondary | ICD-10-CM | POA: Diagnosis not present

## 2023-01-02 DIAGNOSIS — G4709 Other insomnia: Secondary | ICD-10-CM | POA: Diagnosis not present

## 2023-01-03 LAB — COMPREHENSIVE METABOLIC PANEL
ALT: 13 IU/L (ref 0–32)
AST: 22 IU/L (ref 0–40)
Albumin/Globulin Ratio: 2 (ref 1.2–2.2)
Albumin: 4.5 g/dL (ref 3.9–4.9)
Alkaline Phosphatase: 100 IU/L (ref 44–121)
BUN/Creatinine Ratio: 16 (ref 12–28)
BUN: 10 mg/dL (ref 8–27)
Bilirubin Total: 0.3 mg/dL (ref 0.0–1.2)
CO2: 24 mmol/L (ref 20–29)
Calcium: 9.4 mg/dL (ref 8.7–10.3)
Chloride: 105 mmol/L (ref 96–106)
Creatinine, Ser: 0.62 mg/dL (ref 0.57–1.00)
Globulin, Total: 2.3 g/dL (ref 1.5–4.5)
Glucose: 93 mg/dL (ref 70–99)
Potassium: 4.7 mmol/L (ref 3.5–5.2)
Sodium: 142 mmol/L (ref 134–144)
Total Protein: 6.8 g/dL (ref 6.0–8.5)
eGFR: 99 mL/min/{1.73_m2} (ref >59)

## 2023-01-03 LAB — CBC
Hematocrit: 35.2 % (ref 34.0–46.6)
Hemoglobin: 11.8 g/dL (ref 11.1–15.9)
MCH: 30.4 pg (ref 26.6–33.0)
MCHC: 33.5 g/dL (ref 31.5–35.7)
MCV: 91 fL (ref 79–97)
Platelets: 292 10*3/uL (ref 150–450)
RBC: 3.88 x10E6/uL (ref 3.77–5.28)
RDW: 14 % (ref 11.7–15.4)
WBC: 3.6 10*3/uL (ref 3.4–10.8)

## 2023-01-28 NOTE — Progress Notes (Unsigned)
Chief Complaint:   OBESITY Nicole Maynard is here to discuss Nicole Maynard progress with Nicole Maynard obesity treatment plan along with follow-up of Nicole Maynard obesity related diagnoses. Nicole Maynard is on the [employer] and states Nicole Maynard is following Nicole Maynard eating plan approximately 99% of the time. Nicole Maynard states Nicole Maynard is gym exercise 45-60 minutes 4-5 times per week.  Today's visit was #: 3 Starting weight: 191 lbs Starting date: 11/28/2022 Today's weight: 189 lbs Today's date: 01/02/2023 Total lbs lost to date: 2 lbs Total lbs lost since last in-office visit: +3 lbs  Interim History: Had some travel and eating out with the holidays.  Back on eating plan.  Resume gym workouts, 1/3.  Walking hills on treadmill 40 minutes 4-5 times per week.  Has not added weight training.   Subjective:   1. Other specified intestinal malabsorption S/P RYGB in [place], [place] in 2011.  274 lbs to 178 lbs (Nadir).  Patient is taking B12 500 mcg 3 times per week. Nicole Maynard is taking ferrous sulfate 325 mg daily.   2. Vitamin D deficiency Patient taking prescription Vitamin D 50,000 IU weekly.  Last Vitamin D level 52 on 11/28/2022.  3. Other insomnia Patient complains of waking up around 2-3 AM and unable to fall back asleep. Melatonin not helping.  Patient avoids screen time late at night.  Patient denies high anxiety.  Has been drinking a glass of wine every night at 8 pm.   Assessment/Plan:   1. Other specified intestinal malabsorption Check labs today.  - Comprehensive metabolic panel - CBC  2. Vitamin D deficiency Continue prescription Vitamin D 50,000 IU weekly.   3. Other insomnia Discontinue ETOH at night.  Explained how alcohol can disrupt deep sleep.   4. Obesity,current BMI 30.6 Can add high protein and low sugar oatmeal.  Meat with personal trainer at gym.  Reduce high intake of fruit.  Sherika is currently in the action stage of change. As such, Nicole Maynard goal is to continue with weight loss efforts. Nicole Maynard has  agreed to the [employer].   Exercise goals:  Cardio, weights 5 days per week.   Behavioral modification strategies: increasing lean protein intake, increasing water intake, decreasing alcohol intake, decreasing eating out, no skipping meals, meal planning and cooking strategies, keeping healthy foods in the home, avoiding temptations, and decreasing junk food.  Mazel has agreed to follow-up with our clinic in 4 weeks. Nicole Maynard was informed of the importance of frequent follow-up visits to maximize Nicole Maynard success with intensive lifestyle modifications for Nicole Maynard multiple health conditions.   Rheta was informed we would discuss Nicole Maynard lab results at Nicole Maynard next visit unless there is a critical issue that needs to be addressed sooner. Rylee agreed to keep Nicole Maynard next visit at the agreed upon time to discuss these results.  Objective:   Blood pressure 134/77, pulse 72, temperature 98 F (36.7 C), height 5\' 6"  (1.676 m), weight 189 lb (85.7 kg), SpO2 97 %. Body mass index is 30.51 kg/m.  General: Cooperative, alert, well developed, in no acute distress. HEENT: Conjunctivae and lids unremarkable. Cardiovascular: Regular rhythm.  Lungs: Normal work of breathing. Neurologic: No focal deficits.   Lab Results  Component Value Date   CREATININE 0.62 01/02/2023   BUN 10 01/02/2023   NA 142 01/02/2023   K 4.7 01/02/2023   CL 105 01/02/2023   CO2 24 01/02/2023   Lab Results  Component Value Date   ALT 13 01/02/2023   AST 22 01/02/2023   ALKPHOS 100 01/02/2023  BILITOT 0.3 01/02/2023   Lab Results  Component Value Date   HGBA1C 5.5 11/28/2022   Lab Results  Component Value Date   INSULIN 4.3 11/28/2022   Lab Results  Component Value Date   TSH 1.330 11/28/2022   Lab Results  Component Value Date   CHOL 195 11/28/2022   HDL 78 11/28/2022   LDLCALC 107 (H) 11/28/2022   TRIG 55 11/28/2022   CHOLHDL 2.5 11/28/2022   No results found for: "VD25OH" Lab Results  Component Value  Date   WBC 3.6 01/02/2023   HGB 11.8 01/02/2023   HCT 35.2 01/02/2023   MCV 91 01/02/2023   PLT 292 01/02/2023   Lab Results  Component Value Date   IRON 80 11/28/2022   TIBC 401 11/28/2022   FERRITIN 26 11/28/2022   Attestation Statements:   Reviewed by clinician on day of visit: allergies, medications, problem list, medical history, surgical history, family history, social history, and previous encounter notes.  I, Davy Pique, am acting as Location manager for Loyal Gambler, DO.  I have reviewed the above documentation for accuracy and completeness, and I agree with the above. Dell Ponto, DO

## 2023-02-04 DIAGNOSIS — E669 Obesity, unspecified: Secondary | ICD-10-CM | POA: Insufficient documentation

## 2023-02-05 ENCOUNTER — Ambulatory Visit (INDEPENDENT_AMBULATORY_CARE_PROVIDER_SITE_OTHER): Payer: BC Managed Care – PPO | Admitting: Family Medicine

## 2023-02-05 ENCOUNTER — Encounter (INDEPENDENT_AMBULATORY_CARE_PROVIDER_SITE_OTHER): Payer: Self-pay | Admitting: Family Medicine

## 2023-02-05 VITALS — BP 107/73 | HR 74 | Temp 98.0°F | Ht 66.0 in | Wt 182.0 lb

## 2023-02-05 DIAGNOSIS — Z6829 Body mass index (BMI) 29.0-29.9, adult: Secondary | ICD-10-CM | POA: Diagnosis not present

## 2023-02-05 DIAGNOSIS — E559 Vitamin D deficiency, unspecified: Secondary | ICD-10-CM | POA: Diagnosis not present

## 2023-02-05 DIAGNOSIS — R635 Abnormal weight gain: Secondary | ICD-10-CM

## 2023-02-05 DIAGNOSIS — Z9884 Bariatric surgery status: Secondary | ICD-10-CM | POA: Diagnosis not present

## 2023-02-05 DIAGNOSIS — E669 Obesity, unspecified: Secondary | ICD-10-CM

## 2023-02-05 DIAGNOSIS — E66811 Obesity, class 1: Secondary | ICD-10-CM

## 2023-02-05 DIAGNOSIS — D508 Other iron deficiency anemias: Secondary | ICD-10-CM

## 2023-02-05 NOTE — Assessment & Plan Note (Signed)
Last vitamin D level 12/6 at 52.  Taking prescription vitamin D 50,000 IU once weekly.  Energy level is improving

## 2023-02-05 NOTE — Assessment & Plan Note (Signed)
Net weight loss 9 pounds in the past 2 months medically supervised weight management BMI is now less than 30 She is growing tired of the pescatarian meal plan and would like more of a plant-based diet.  We discussed moving into a vegan style diet with examples of meals provided today.  Add in resistance training with a personal trainer 2 days a week.

## 2023-02-05 NOTE — Progress Notes (Signed)
Office: [phone]  /  Fax: [facility] Height: 5' 6"$  (1.676 m) Weight: 182 lb (82.6 kg) Temp: 98 F (36.7 C) Pulse Rate: 74 BP: 107/73 SpO2: 100 % Fasting: no Labs: no Today's Visit #: 4 Weight at Last VIsit: 189lb Weight Lost Since Last Visit: 7lb  Body Fat %: 39.4 % Fat Mass (lbs): 71.8 lbs Muscle Mass (lbs): 104.6 lbs Total Body Water (lbs): 69.6 lbs Visceral Fat Rating : 10 Starting Date: 11/28/22 Starting Weight: 191lb Total Weight Loss (lbs): 9 lb (4.082 kg)    HPI  Chief Complaint: OBESITY  Nicole Maynard is here to discuss her progress with her obesity treatment plan. She is on the the [facility] and states she is following her eating plan approximately 90 % of the time. She states she is exercising at [employer]. 60 minutes 5 times per week.   Interval History:  Since last office visit she is down 7 lb.  She is walking on the treadmill 50 min 5 days/ wk.  She has not added in weight training.  Energy level is improving. Cut back on ETOH.  Added in an herbal tea at night.  Cut out caffeine.  Sleeping better without wine at night.   Pharmacotherapy: none  PHYSICAL EXAM:  Blood pressure 107/73, pulse 74, temperature 98 F (36.7 C), height 5' 6"$  (1.676 m), weight 182 lb (82.6 kg), SpO2 100 %. Body mass index is 29.38 kg/m.  General: She is overweight, cooperative, alert, well developed, and in no acute distress. PSYCH: Has normal mood, affect and thought process.   HEENT: EOMI, sclerae are anicteric. Lungs: Normal breathing effort, no conversational dyspnea. Extremities: No edema.  Neurologic: No gross sensory or motor deficits. No tremors or fasciculations noted.    DIAGNOSTIC DATA REVIEWED:  BMET    Component Value Date/Time   NA 142 01/02/2023 1235   K 4.7 01/02/2023 1235   CL 105 01/02/2023 1235   CO2 24 01/02/2023 1235   GLUCOSE 93 01/02/2023 1235   BUN 10 01/02/2023 1235    CREATININE 0.62 01/02/2023 1235   CALCIUM 9.4 01/02/2023 1235   Lab Results  Component Value Date   HGBA1C 5.5 11/28/2022   Lab Results  Component Value Date   INSULIN 4.3 11/28/2022   Lab Results  Component Value Date   TSH 1.330 11/28/2022   CBC    Component Value Date/Time   WBC 3.6 01/02/2023 1235   RBC 3.88 01/02/2023 1235   HGB 11.8 01/02/2023 1235   HCT 35.2 01/02/2023 1235   PLT 292 01/02/2023 1235   MCV 91 01/02/2023 1235   MCH 30.4 01/02/2023 1235   MCHC 33.5 01/02/2023 1235   RDW 14.0 01/02/2023 1235   Iron Studies    Component Value Date/Time   IRON 80 11/28/2022 0915   TIBC 401 11/28/2022 0915   FERRITIN 26 11/28/2022 0915   IRONPCTSAT 20 11/28/2022 0915   Lipid Panel     Component Value Date/Time   CHOL 195 11/28/2022 0915   TRIG 55 11/28/2022 0915   HDL 78 11/28/2022 0915   CHOLHDL 2.5 11/28/2022 0915   LDLCALC 107 (H) 11/28/2022 0915   Hepatic Function Panel     Component Value Date/Time   PROT 6.8 01/02/2023 1235   ALBUMIN 4.5 01/02/2023 1235   AST 22 01/02/2023 1235   ALT 13 01/02/2023 1235   ALKPHOS 100 01/02/2023 1235   BILITOT 0.3 01/02/2023 1235  Component Value Date/Time   TSH 1.330 11/28/2022 0915   Nutritional No results found for: "VD25OH"   ASSESSMENT AND PLAN  TREATMENT PLAN FOR OBESITY:  Recommended Dietary Goals  Nicole Maynard is currently in the action stage of change. As such, her goal is to continue weight management plan. She has agreed to a vegan plan.  Behavioral Intervention  We discussed the following Behavioral Modification Strategies today: increasing lean protein intake, increasing vegetables, increasing fiber rich foods, increase water intake, work on meal planning and easy cooking plans, and think about ways to increase physical activity.  Additional resources provided today: NA  Recommended Physical Activity Goals  Delories has been advised to work up to 150 minutes of moderate intensity  aerobic activity a week and strengthening exercises 2-3 times per week for cardiovascular health, weight loss maintenance and preservation of muscle mass.   She has agreed to increase physical activity in their day and reduce sedentary time (increase NEAT).  and Will begin resistance exercise 30 minutes, 2 times per week. Chosen activity weight training w/ a trainer.   Pharmacotherapy We discussed various medication options to help [facility] with her weight loss efforts and we both agreed to none.  ASSOCIATED CONDITIONS ADDRESSED TODAY  Vitamin D deficiency Assessment & Plan: Last vitamin D level 12/6 at 44.  Taking prescription vitamin D 50,000 IU once weekly.  Energy level is improving   Generalized obesity Assessment & Plan: Net weight loss 9 pounds in the past 2 months medically supervised weight management BMI is now less than 30 She is growing tired of the pescatarian meal plan and would like more of a plant-based diet.  We discussed moving into a vegan style diet with examples of meals provided today.  Add in resistance training with a personal trainer 2 days a week.   Obesity,current BMI 29.4  Weight gain following gastric bypass surgery Assessment & Plan: Doing well with weight reduction and has improved satiety status post gastric bypass.  Takes a multivitamin once daily.  Denies GI upset.       No follow-ups on file.Marland Kitchen She was informed of the importance of frequent follow up visits to maximize her success with intensive lifestyle modifications for her multiple health conditions.   ATTESTASTION STATEMENTS:  Reviewed by clinician on day of visit: allergies, medications, problem list, medical history, surgical history, family history, social history, and previous encounter notes.   Time spent on visit including pre-visit chart review and post-visit care and charting was 30 minutes.    Dell Ponto, DO

## 2023-02-05 NOTE — Assessment & Plan Note (Signed)
Doing well with weight reduction and has improved satiety status post gastric bypass.  Takes a multivitamin once daily.  Denies GI upset.

## 2023-02-12 ENCOUNTER — Encounter (INDEPENDENT_AMBULATORY_CARE_PROVIDER_SITE_OTHER): Payer: Self-pay

## 2023-02-23 IMAGING — US US RENAL ARTERY STENOSIS
1 series · 13 of 25 positions shown · non-contrast
Comparison: None.

CLINICAL DATA: Essential hypertension.

EXAM:
RENAL/URINARY TRACT ULTRASOUND
RENAL DUPLEX DOPPLER ULTRASOUND

[Series 1: us renal artery stenosis · 0.23mm/px · 13 of 118 slices shown]
[im 1/118]
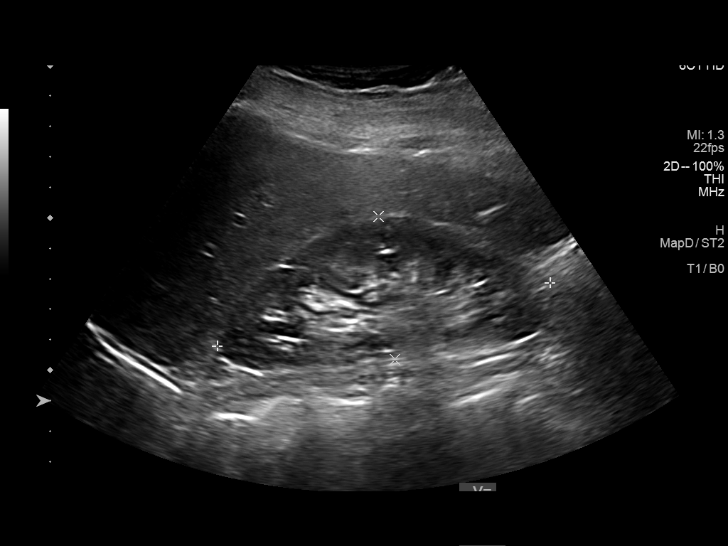
[im 10/118]
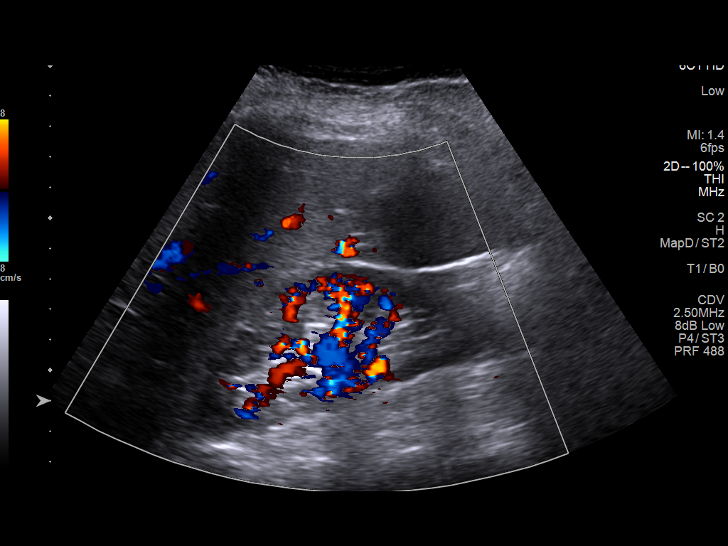
[im 20/118]
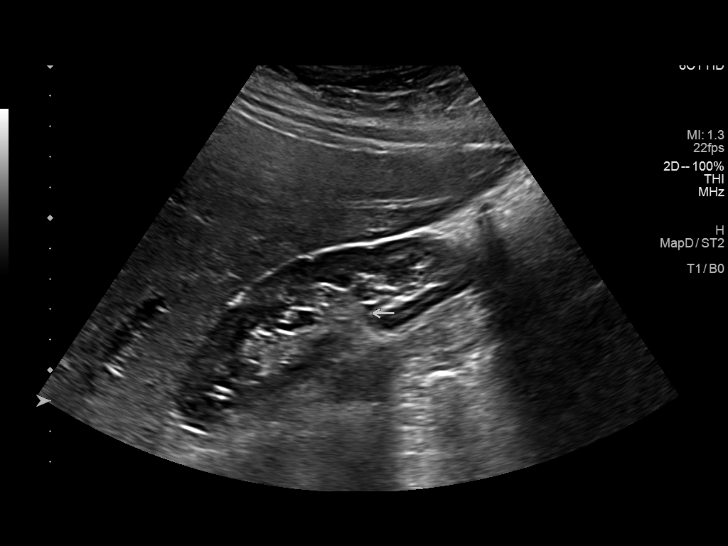
[im 30/118]
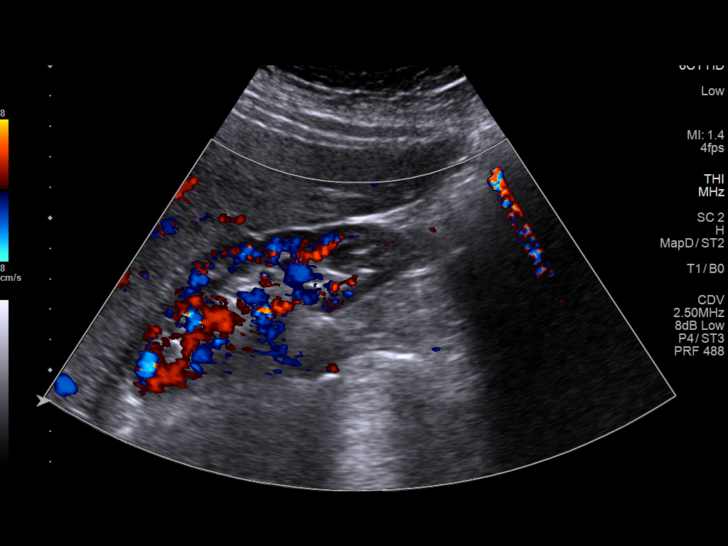
[im 40/118]
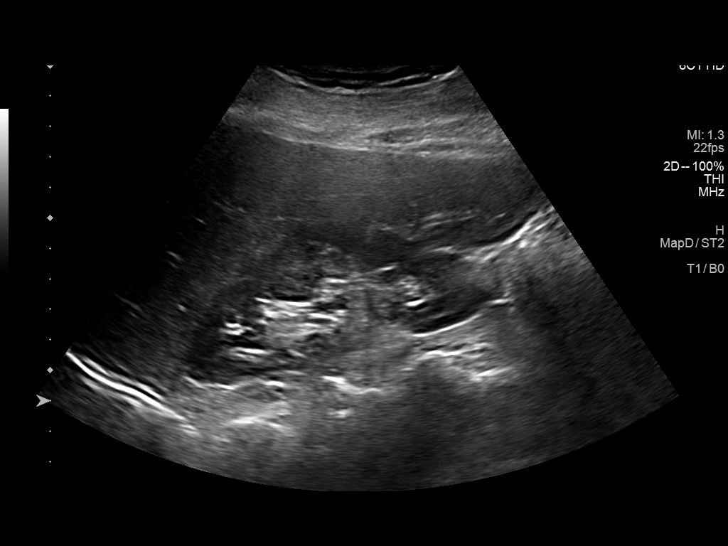
[im 49/118]
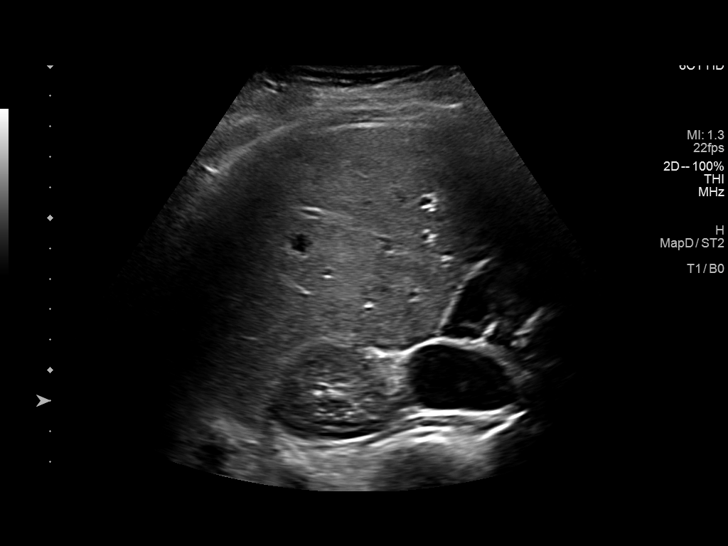
[im 59/118]
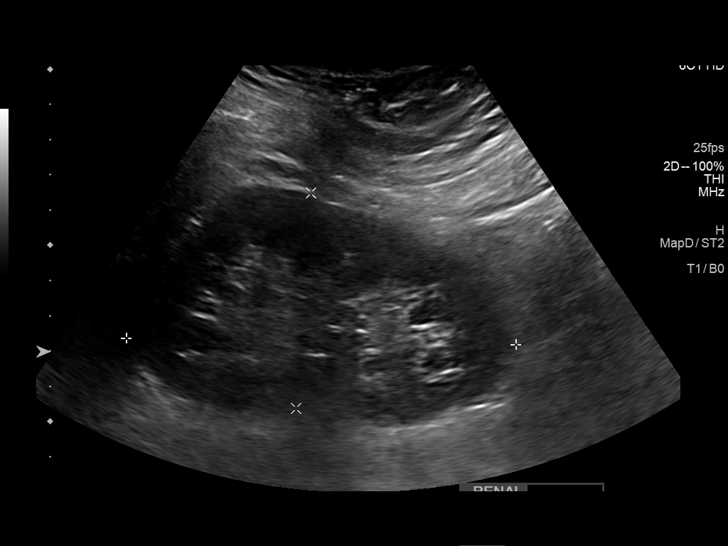
[im 69/118]
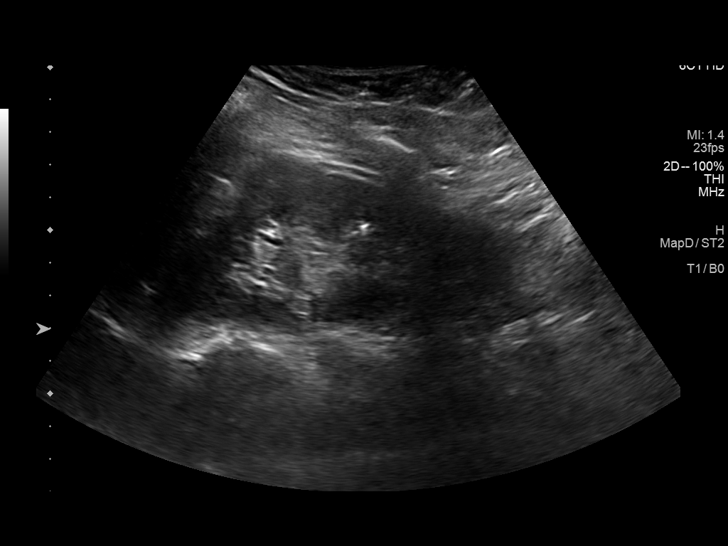
[im 79/118]
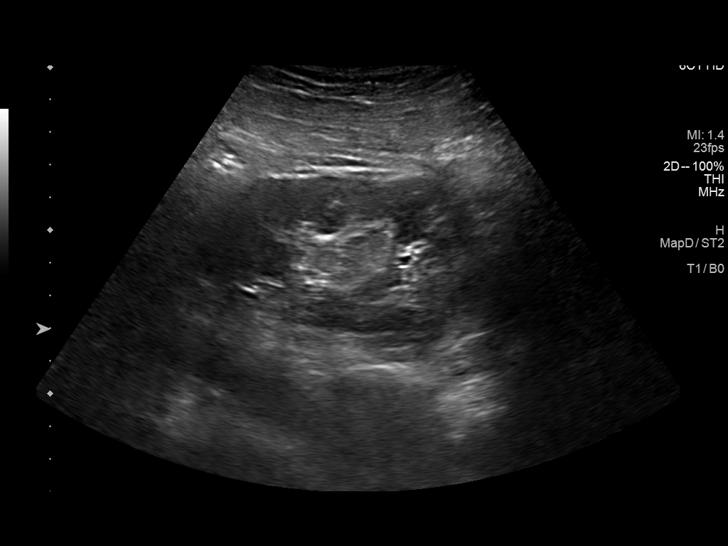
[im 88/118]
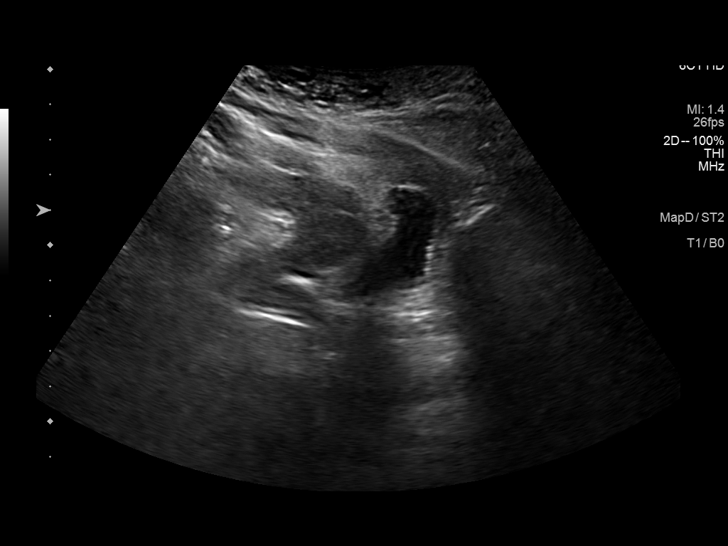
[im 98/118]
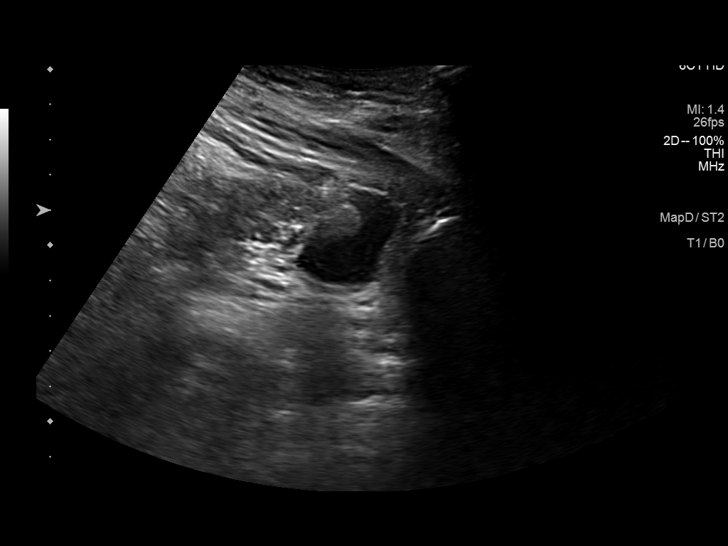
[im 108/118]
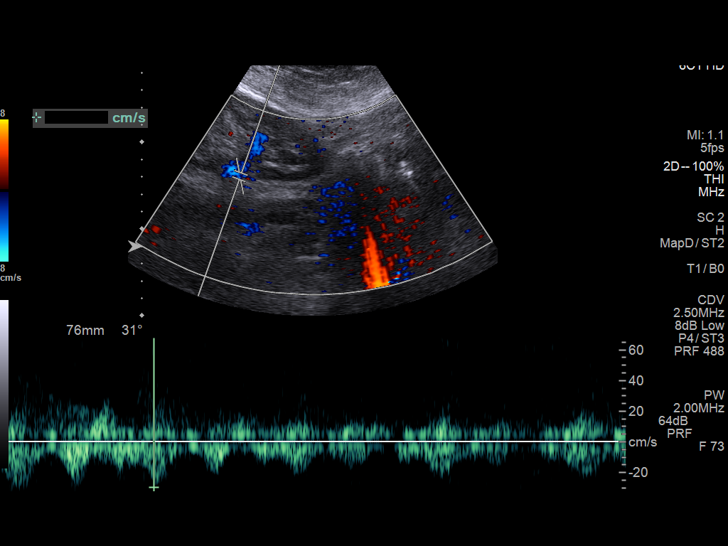
[im 118/118]
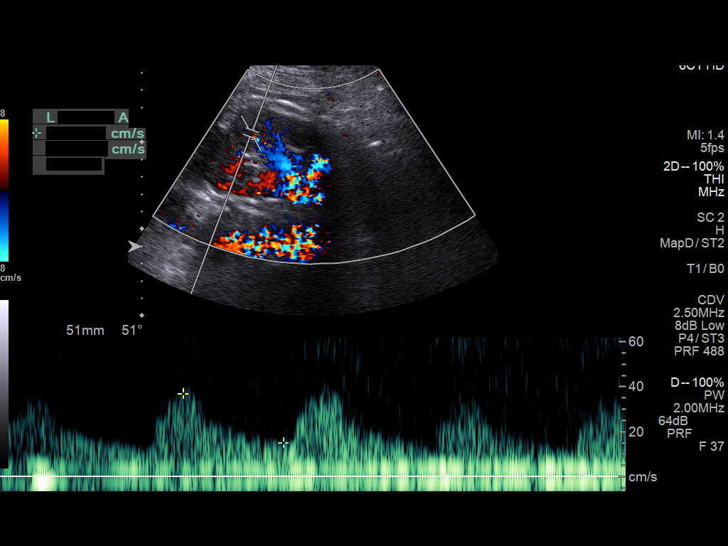

[13 of 25 positions shown; findings below may reference images not displayed]

FINDINGS: Right Kidney:

Length: 11.1. Echogenicity within normal limits. No mass or
hydronephrosis visualized.

Left Kidney:

Length: 11.1. Echogenicity within normal limits. No mass or
hydronephrosis visualized.

Bladder:  Decompressed.  Postvoid.

Incidental echogenic liver.

RENAL DUPLEX ULTRASOUND

RIGHT renal Artery Velocities:

Origin:  139 cm/sec

Mid:  162 cm/sec

Hilum:  149 cm/sec

Interlobar:  43 cm/sec

Arcuate:  33 cm/sec

The RIGHT renal vein is patent.

LEFT renal Artery Velocities:

Origin:  53 cm/sec, suspect accessory LEFT renal artery

Mid:  105 cm/sec

Hilum:  68 cm/sec

Interlobar:  46 cm/sec

Arcuate:  27 cm/sec

The LEFT renal vein is patent.

Aortic Velocity:  90 cm/sec

Right Renal-Aortic Ratios:

Origin:

Mid:

Hilum:

Interlobar:

Arcuate:

Left Renal-Aortic Ratios:

Origin:

Mid:

Hilum:

Interlobar:

Arcuate: 5
IMPRESSION: 1. No Doppler evidence of renal artery stenosis.
2. Normal sonographic appearance of the kidneys.  No hydronephrosis.
3. Incidental echogenic liver. Findings most commonly seen in
hepatic steatosis though may also represent hepatitis and/or
fibrosis.

## 2023-03-05 ENCOUNTER — Encounter (INDEPENDENT_AMBULATORY_CARE_PROVIDER_SITE_OTHER): Payer: Self-pay | Admitting: Family Medicine

## 2023-03-05 ENCOUNTER — Ambulatory Visit (INDEPENDENT_AMBULATORY_CARE_PROVIDER_SITE_OTHER): Payer: BC Managed Care – PPO | Admitting: Family Medicine

## 2023-03-05 VITALS — BP 127/71 | HR 66 | Temp 98.2°F | Ht 66.0 in | Wt 183.0 lb

## 2023-03-05 DIAGNOSIS — I10 Essential (primary) hypertension: Secondary | ICD-10-CM

## 2023-03-05 DIAGNOSIS — E669 Obesity, unspecified: Secondary | ICD-10-CM | POA: Diagnosis not present

## 2023-03-05 DIAGNOSIS — Z9884 Bariatric surgery status: Secondary | ICD-10-CM | POA: Diagnosis not present

## 2023-03-05 DIAGNOSIS — R635 Abnormal weight gain: Secondary | ICD-10-CM

## 2023-03-05 DIAGNOSIS — Z6829 Body mass index (BMI) 29.0-29.9, adult: Secondary | ICD-10-CM

## 2023-03-05 MED ORDER — LOMAIRA 8 MG PO TABS: ORAL_TABLET | ORAL | 0 refills | Status: DC

## 2023-03-05 NOTE — Progress Notes (Signed)
Office: [phone]  /  Fax: [phone]  WEIGHT SUMMARY AND BIOMETRICS  Vitals Temp: 98.2 F (36.8 C) BP: 127/71 Pulse Rate: 66 SpO2: 100 %   Anthropometric Measurements Height: '5\' 6"'$  (1.676 m) Weight: 183 lb (83 kg) BMI (Calculated): 29.55 Weight at Last Visit: 182lb Weight Lost Since Last Visit: +1 Starting Weight: 191lb Total Weight Loss (lbs): 8 lb (3.629 kg)   Body Composition  Body Fat %: 40.7 % Fat Mass (lbs): 74.4 lbs Muscle Mass (lbs): 103 lbs Total Body Water (lbs): 72.2 lbs Visceral Fat Rating : 11   Other Clinical Data Fasting: no Labs: no Today's Visit #: 5 Starting Date: 11/28/22   HPI  Chief Complaint: OBESITY  Maili is here to discuss her progress with her obesity treatment plan. She is on the the [place] and states she is following her eating plan approximately 90 % of the time. She states she is exercising 40-50 minutes 4-5 times per week.   Interval History:  Since last office visit she is up 1 lb Walking hills 45 min 5 days/ wk She is eating 3 meals/ day Sleeping better Hasn't added in weight training due to rotator cuff - scheduled for MRI with [facility] Downloaded Just Fit for at home body weight training Energy level improving Feels 'stuck' at this weight  Pharmacotherapy: none  PHYSICAL EXAM:  Blood pressure 127/71, pulse 66, temperature 98.2 F (36.8 C), height '5\' 6"'$  (1.676 m), weight 183 lb (83 kg), SpO2 100 %. Body mass index is 29.54 kg/m.  General: She is overweight, cooperative, alert, well developed, and in no acute distress. PSYCH: Has normal mood, affect and thought process.   Lungs: Normal breathing effort, no conversational dyspnea.   ASSESSMENT AND PLAN  TREATMENT PLAN FOR OBESITY:  Recommended Dietary Goals  Charnisha is currently in the action stage of change. As such, her goal is to continue weight management plan. She has agreed to the [place].  Behavioral  Intervention  We discussed the following Behavioral Modification Strategies today: increasing lean protein intake, increasing vegetables, increasing water intake, work on meal planning and easy cooking plans, and work on managing stress, creating time for self-care and relaxation measures. Reduce nighttime intake of fruit  Additional resources provided today: NA  Recommended Physical Activity Goals  Dorey has been advised to work up to 150 minutes of moderate intensity aerobic activity a week and strengthening exercises 2-3 times per week for cardiovascular health, weight loss maintenance and preservation of muscle mass.   She has agreed to Will begin resistance exercise 10 minutes, 3 times per week. Chosen activity body weight training.  Pharmacotherapy changes for the treatment of obesity:   ASSOCIATED CONDITIONS ADDRESSED TODAY  Weight gain status post gastric bypass Assessment & Plan: Improving Net weight loss 7 lb in 3 mos of medically supervised weight mgmt Previous nadir weight 178 lb Target weight: 165 lb (has not been this weight for many years) Added cardio 5 days/ wk Limited with weight training - seeing ortho  Taking a MVI daily Annual bariatric labs were updated 11/28/22  Begin Lomaira for BMI 29 + HTN with plateau in weight loss PDMP reviewed. BP/ HR are WNL. Reviewed MOA and potential adverse side effects Informed consent signed.  Orders: -     Lomaira; 1 tab po 30 min before breakfast daily  Dispense: 30 tablet; Refill: 0  Generalized obesity  BMI 29.0-29.9,adult  Essential hypertension Assessment & Plan: BP well controlled, now on a lower dose  of olmesartan daily Limits high sodium foods Hydrates well with water  Look for further BP improvements with weight reduction       She was informed of the importance of frequent follow up visits to maximize her success with intensive lifestyle modifications for her multiple health  conditions.   ATTESTASTION STATEMENTS:  Reviewed by clinician on day of visit: allergies, medications, problem list, medical history, surgical history, family history, social history, and previous encounter notes pertinent to obesity diagnosis.   I have personally spent 30 minutes total time today in preparation, patient care, nutritional counseling and documentation for this visit, including the following: review of clinical lab tests; review of medical tests/procedures/services.      Dell Ponto, DO DABFM, DABOM [facility] [address] [place], [place] [address] [phone]

## 2023-03-05 NOTE — Assessment & Plan Note (Signed)
Improving Net weight loss 7 lb in 3 mos of medically supervised weight mgmt Previous nadir weight 178 lb Target weight: 165 lb (has not been this weight for many years) Added cardio 5 days/ wk Limited with weight training - seeing ortho  Taking a MVI daily Annual bariatric labs were updated 11/28/22  Begin Lomaira for BMI 29 + HTN with plateau in weight loss PDMP reviewed. BP/ HR are WNL. Reviewed MOA and potential adverse side effects Informed consent signed.

## 2023-03-05 NOTE — Assessment & Plan Note (Signed)
BP well controlled, now on a lower dose of olmesartan daily Limits high sodium foods Hydrates well with water  Look for further BP improvements with weight reduction

## 2023-04-02 ENCOUNTER — Encounter (INDEPENDENT_AMBULATORY_CARE_PROVIDER_SITE_OTHER): Payer: Self-pay | Admitting: Family Medicine

## 2023-04-02 ENCOUNTER — Ambulatory Visit (INDEPENDENT_AMBULATORY_CARE_PROVIDER_SITE_OTHER): Payer: BC Managed Care – PPO | Admitting: Family Medicine

## 2023-04-02 VITALS — BP 119/79 | HR 59 | Temp 98.0°F | Ht 66.0 in | Wt 180.0 lb

## 2023-04-02 DIAGNOSIS — R635 Abnormal weight gain: Secondary | ICD-10-CM

## 2023-04-02 DIAGNOSIS — E669 Obesity, unspecified: Secondary | ICD-10-CM | POA: Diagnosis not present

## 2023-04-02 DIAGNOSIS — Z9884 Bariatric surgery status: Secondary | ICD-10-CM | POA: Diagnosis not present

## 2023-04-02 DIAGNOSIS — Z6829 Body mass index (BMI) 29.0-29.9, adult: Secondary | ICD-10-CM | POA: Diagnosis not present

## 2023-04-02 NOTE — Assessment & Plan Note (Signed)
Improving.  She is 2 lb from her previous post op nadir weight.  She would like to reach her goal weight of 165 lb.  Annual bariatric labs updated 12/6.    Continue a Multivitamin, Calcium with Vitamin D as directed Continue to work on a higher protein vegetarian diet.

## 2023-04-02 NOTE — Progress Notes (Signed)
Office: [phone]  /  Fax: [phone]  WEIGHT SUMMARY AND BIOMETRICS  Starting Date: 11/28/22  Starting Weight: 191lb   Weight Lost Since Last Visit: 3lb   Vitals Temp: 98 F (36.7 C) BP: 119/79 Pulse Rate: (!) 59 SpO2: 100 %   Body Composition  Body Fat %: 40.6 % Fat Mass (lbs): 73.2 lbs Muscle Mass (lbs): 101.8 lbs Total Body Water (lbs): 70.4 lbs Visceral Fat Rating : 10     HPI  Chief Complaint: OBESITY  Nicole Maynard is here to discuss her progress with her obesity treatment plan. She is on the the [facility] and states she is following her eating plan approximately 85 % of the time. She states she is exercising 60 minutes 5 times per week.  Interval History:  Since last office visit she is down 3 lbs She is down 1.2 lb of muscle mass and is down 1.2 lb of body fat She is taking Lomaira 8 mg tab each morning She is back to the gym 5 days/ wk- doing only cardio due to knee and shoulder pain She is still struggling to get in all of her protein but is is using the [employer] ap and is using her smartwatch  Check out [facility] (would like one on one personal training)  Pharmacotherapy: Lomaira 8 mg daily  PHYSICAL EXAM:  Blood pressure 119/79, pulse (!) 59, temperature 98 F (36.7 C), height 5\' 6"  (1.676 m), weight 180 lb (81.6 kg), SpO2 100 %. Body mass index is 29.05 kg/m.  General: She is healthy appearing, cooperative, alert, well developed, and in no acute distress. PSYCH: Has normal mood, affect and thought process.   Lungs: Normal breathing effort, no conversational dyspnea.   ASSESSMENT AND PLAN  TREATMENT PLAN FOR OBESITY:  Recommended Dietary Goals  Nicole Maynard is currently in the action stage of change. As such, her goal is to continue weight management plan. She has agreed to the Vegetarian Plan.  Behavioral Intervention  We discussed the following Behavioral Modification Strategies today: increasing lean protein  intake, increasing fiber rich foods, increasing water intake, work on meal planning and preparation, reading food labels , emotional eating strategies and understanding the difference between hunger signals and cravings, and planning for success.  Additional resources provided today: NA  Recommended Physical Activity Goals  Nicole Maynard has been advised to work up to 150 minutes of moderate intensity aerobic activity a week and strengthening exercises 2-3 times per week for cardiovascular health, weight loss maintenance and preservation of muscle mass.   She has agreed to Work on scheduling and tracking physical activity.  Cardio + weight training for a combination of 4-5 days/ wk  Pharmacotherapy changes for the treatment of obesity: discontinue Lomaira (pt declined need to stay on it)  ASSOCIATED CONDITIONS ADDRESSED TODAY  Weight gain following gastric bypass surgery Assessment & Plan: Improving.  She is 2 lb from her previous post op nadir weight.  She would like to reach her goal weight of 165 lb.  Annual bariatric labs updated 12/6.    Continue a Multivitamin, Calcium with Vitamin D as directed Continue to work on a higher protein vegetarian diet.   Generalized obesity  BMI 29.0-29.9,adult      She was informed of the importance of frequent follow up visits to maximize her success with intensive lifestyle modifications for her multiple health conditions.   ATTESTASTION STATEMENTS:  Reviewed by clinician on day of visit: allergies, medications, problem list, medical history, surgical history, family history,  social history, and previous encounter notes pertinent to obesity diagnosis.   I have personally spent 30 minutes total time today in preparation, patient care, nutritional counseling and documentation for this visit, including the following: review of clinical lab tests; review of medical tests/procedures/services.      Glennis Brink, DO DABFM, DABOM [facility] [address] [place], [place] [address] [phone]

## 2023-06-03 ENCOUNTER — Encounter (INDEPENDENT_AMBULATORY_CARE_PROVIDER_SITE_OTHER): Payer: Self-pay | Admitting: Family Medicine

## 2023-06-03 ENCOUNTER — Ambulatory Visit (INDEPENDENT_AMBULATORY_CARE_PROVIDER_SITE_OTHER): Payer: BC Managed Care – PPO | Admitting: Family Medicine

## 2023-06-03 VITALS — BP 116/78 | HR 97 | Temp 98.0°F | Ht 66.0 in | Wt 180.0 lb

## 2023-06-03 DIAGNOSIS — Z6829 Body mass index (BMI) 29.0-29.9, adult: Secondary | ICD-10-CM | POA: Diagnosis not present

## 2023-06-03 DIAGNOSIS — Z9884 Bariatric surgery status: Secondary | ICD-10-CM

## 2023-06-03 DIAGNOSIS — R635 Abnormal weight gain: Secondary | ICD-10-CM | POA: Diagnosis not present

## 2023-06-03 DIAGNOSIS — E669 Obesity, unspecified: Secondary | ICD-10-CM

## 2023-06-03 NOTE — Progress Notes (Signed)
Office: [phone]  /  Fax: [phone]  WEIGHT SUMMARY AND BIOMETRICS  Starting Date: 11/28/22  Starting Weight: 191lb   Weight Lost Since Last Visit: 0lb   Vitals Temp: 98 F (36.7 C) BP: 116/78 Pulse Rate: 97 SpO2: 96 %   Body Composition  Body Fat %: 39.8 % Fat Mass (lbs): 71.6 lbs Muscle Mass (lbs): 103 lbs Total Body Water (lbs): 70.2 lbs Visceral Fat Rating : 10     HPI  Chief Complaint: OBESITY  Nicole Maynard is here to discuss her progress with her obesity treatment plan. She is on the the Vegetarian Plan and states she is following her eating plan approximately 75 % of the time. She states she is exercising 30-60 minutes 5 times per week.   Interval History:  Since last office visit she is  down 0 lb She gained 1.2 lb muscle and lost 1.6 lb  body fat in the past 2 mos She has been worried about the scale She is 2 lb from her nadir weight post gastric bypass surgery She did stop Lomaira 8 mg >2 mos ago after failing to see any difference Hunger and cravings are under good control She is making better food choices She did increase her protein intake getting ~85 g of protein intake She is eating out less Denies sugar cravings Added in a resistance training 2-3 x a week and walking 30 min daily, tracking steps  Pharmacotherapy: none  PHYSICAL EXAM:  Blood pressure 116/78, pulse 97, temperature 98 F (36.7 C), height 5\' 6"  (1.676 m), weight 180 lb (81.6 kg), SpO2 96 %. Body mass index is 29.05 kg/m.  General: She is pleasant appearing, cooperative, alert, well developed, and in no acute distress. PSYCH: Has normal mood, affect and thought process.   Lungs: Normal breathing effort, no conversational dyspnea.   ASSESSMENT AND PLAN  TREATMENT PLAN FOR OBESITY:  Recommended Dietary Goals  Nicole Maynard is currently in the action stage of change. As such, her goal is to continue weight management plan. She has agreed to the [employer].  Behavioral Intervention  We discussed the following Behavioral Modification Strategies today: increasing lean protein intake, decreasing simple carbohydrates , increasing vegetables, increasing lower glycemic fruits, avoiding skipping meals, increasing water intake, work on meal planning and preparation, keeping healthy foods at home, continue to practice mindfulness when eating, and planning for success.  Additional resources provided today: NA  Recommended Physical Activity Goals  Nicole Maynard has been advised to work up to 150 minutes of moderate intensity aerobic activity a week and strengthening exercises 2-3 times per week for cardiovascular health, weight loss maintenance and preservation of muscle mass.   She has agreed to Continue current level of physical activity   Pharmacotherapy changes for the treatment of obesity: none  ASSOCIATED CONDITIONS ADDRESSED TODAY  Weight gain following gastric bypass surgery Assessment & Plan: Pt has been able to keep off 94 lb since her gastric bypass surgery and is only up 2 lb from her previous nadir weight of 178 lb Reassured pt that her BMI is <30, her glucose, BP and lipids are all under good control and she is slowly starting to see muscle gain and smaller amounts of reduced body fat.    We discussed resetting her target weight to 175 lb, focusing on building muscle mass and consistency with healthy eating habits Continue to track daily steps and get in weight training 2-3 x a week Continue a MVI, iron RX and vitamin D weekly per  PCP   Generalized obesity with starting Bmi 30.8 Assessment & Plan: Reviewed her overall progress, down 11 lb in the past 7 mos of medically supervised weight management She is starting to see muscle gain by increasing her protein intake to 85 g/ day and adding in weight training 2-3 x a week She did not see a benefit while on Lomaira, and remains off   Reassured patient that she is at a healthy weight  and consistency is key to maintaining a healthy weight. Do not obsess over the scale at home Follow up in 2 mos   BMI 29.0-29.9,adult      She was informed of the importance of frequent follow up visits to maximize her success with intensive lifestyle modifications for her multiple health conditions.   ATTESTASTION STATEMENTS:  Reviewed by clinician on day of visit: allergies, medications, problem list, medical history, surgical history, family history, social history, and previous encounter notes pertinent to obesity diagnosis.   I have personally spent 30 minutes total time today in preparation, patient care, nutritional counseling and documentation for this visit, including the following: review of clinical lab tests; review of medical tests/procedures/services.      Nicole Brink, DO DABFM, DABOM [facility] [address] [place], [place] [address] [phone]

## 2023-06-03 NOTE — Assessment & Plan Note (Signed)
Reviewed her overall progress, down 11 lb in the past 7 mos of medically supervised weight management She is starting to see muscle gain by increasing her protein intake to 85 g/ day and adding in weight training 2-3 x a week She did not see a benefit while on Lomaira, and remains off   Reassured patient that she is at a healthy weight and consistency is key to maintaining a healthy weight. Do not obsess over the scale at home Follow up in 2 mos

## 2023-06-03 NOTE — Assessment & Plan Note (Signed)
Pt has been able to keep off 94 lb since her gastric bypass surgery and is only up 2 lb from her previous nadir weight of 178 lb Reassured pt that her BMI is <30, her glucose, BP and lipids are all under good control and she is slowly starting to see muscle gain and smaller amounts of reduced body fat.    We discussed resetting her target weight to 175 lb, focusing on building muscle mass and consistency with healthy eating habits Continue to track daily steps and get in weight training 2-3 x a week Continue a MVI, iron RX and vitamin D weekly per PCP

## 2023-08-01 ENCOUNTER — Encounter (INDEPENDENT_AMBULATORY_CARE_PROVIDER_SITE_OTHER): Payer: Self-pay

## 2023-08-05 ENCOUNTER — Ambulatory Visit (INDEPENDENT_AMBULATORY_CARE_PROVIDER_SITE_OTHER): Payer: BC Managed Care – PPO | Admitting: Family Medicine

## 2024-06-04 DIAGNOSIS — J45998 Other asthma: Secondary | ICD-10-CM | POA: Diagnosis not present

## 2024-06-04 DIAGNOSIS — D649 Anemia, unspecified: Secondary | ICD-10-CM | POA: Diagnosis not present

## 2024-06-04 DIAGNOSIS — I10 Essential (primary) hypertension: Secondary | ICD-10-CM | POA: Diagnosis not present

## 2024-06-04 DIAGNOSIS — R053 Chronic cough: Secondary | ICD-10-CM | POA: Diagnosis not present

## 2024-06-04 DIAGNOSIS — J302 Other seasonal allergic rhinitis: Secondary | ICD-10-CM | POA: Diagnosis not present

## 2024-06-04 DIAGNOSIS — R0981 Nasal congestion: Secondary | ICD-10-CM | POA: Diagnosis not present

## 2024-08-05 DIAGNOSIS — I10 Essential (primary) hypertension: Secondary | ICD-10-CM | POA: Diagnosis not present

## 2024-08-05 DIAGNOSIS — E538 Deficiency of other specified B group vitamins: Secondary | ICD-10-CM | POA: Diagnosis not present

## 2024-08-05 DIAGNOSIS — Z79899 Other long term (current) drug therapy: Secondary | ICD-10-CM | POA: Diagnosis not present

## 2024-08-05 DIAGNOSIS — E559 Vitamin D deficiency, unspecified: Secondary | ICD-10-CM | POA: Diagnosis not present

## 2024-08-05 DIAGNOSIS — D649 Anemia, unspecified: Secondary | ICD-10-CM | POA: Diagnosis not present

## 2024-08-12 DIAGNOSIS — Z1331 Encounter for screening for depression: Secondary | ICD-10-CM | POA: Diagnosis not present

## 2024-08-12 DIAGNOSIS — R82998 Other abnormal findings in urine: Secondary | ICD-10-CM | POA: Diagnosis not present

## 2024-08-12 DIAGNOSIS — G4709 Other insomnia: Secondary | ICD-10-CM | POA: Diagnosis not present

## 2024-08-12 DIAGNOSIS — I10 Essential (primary) hypertension: Secondary | ICD-10-CM | POA: Diagnosis not present

## 2024-08-12 DIAGNOSIS — Z0000 Encounter for general adult medical examination without abnormal findings: Secondary | ICD-10-CM | POA: Diagnosis not present

## 2024-08-12 DIAGNOSIS — E559 Vitamin D deficiency, unspecified: Secondary | ICD-10-CM | POA: Diagnosis not present

## 2024-08-12 DIAGNOSIS — E538 Deficiency of other specified B group vitamins: Secondary | ICD-10-CM | POA: Diagnosis not present

## 2024-08-12 DIAGNOSIS — D72819 Decreased white blood cell count, unspecified: Secondary | ICD-10-CM | POA: Diagnosis not present

## 2024-08-12 DIAGNOSIS — E669 Obesity, unspecified: Secondary | ICD-10-CM | POA: Diagnosis not present

## 2024-08-12 DIAGNOSIS — Z1339 Encounter for screening examination for other mental health and behavioral disorders: Secondary | ICD-10-CM | POA: Diagnosis not present

## 2024-08-12 DIAGNOSIS — Z23 Encounter for immunization: Secondary | ICD-10-CM | POA: Diagnosis not present

## 2024-08-12 DIAGNOSIS — D649 Anemia, unspecified: Secondary | ICD-10-CM | POA: Diagnosis not present

## 2024-10-15 DIAGNOSIS — Z23 Encounter for immunization: Secondary | ICD-10-CM | POA: Diagnosis not present

## 2024-10-29 DIAGNOSIS — D649 Anemia, unspecified: Secondary | ICD-10-CM | POA: Diagnosis not present

## 2025-01-08 ENCOUNTER — Other Ambulatory Visit: Payer: Self-pay | Admitting: Internal Medicine

## 2025-01-08 DIAGNOSIS — Z1231 Encounter for screening mammogram for malignant neoplasm of breast: Secondary | ICD-10-CM

## 2025-01-19 ENCOUNTER — Ambulatory Visit

## 2025-01-26 ENCOUNTER — Ambulatory Visit

## 2025-02-05 ENCOUNTER — Ambulatory Visit
